# Patient Record
Sex: Female | Born: 1980 | Race: White | Hispanic: No | Marital: Married | Smoking: Current every day smoker
Health system: Southern US, Community
[De-identification: ages and names within clinical notes are randomized; demographics above are authoritative.]

## PROBLEM LIST (undated history)

## (undated) DIAGNOSIS — F329 Major depressive disorder, single episode, unspecified: Secondary | ICD-10-CM

## (undated) DIAGNOSIS — F319 Bipolar disorder, unspecified: Secondary | ICD-10-CM

## (undated) DIAGNOSIS — F419 Anxiety disorder, unspecified: Secondary | ICD-10-CM

## (undated) DIAGNOSIS — F32A Depression, unspecified: Secondary | ICD-10-CM

## (undated) DIAGNOSIS — G43909 Migraine, unspecified, not intractable, without status migrainosus: Secondary | ICD-10-CM

## (undated) DIAGNOSIS — T7840XA Allergy, unspecified, initial encounter: Secondary | ICD-10-CM

## (undated) DIAGNOSIS — D649 Anemia, unspecified: Secondary | ICD-10-CM

## (undated) HISTORY — PX: TOTAL ABDOMINAL HYSTERECTOMY W/ BILATERAL SALPINGOOPHORECTOMY: SHX83

## (undated) HISTORY — PX: ABDOMINAL HYSTERECTOMY: SHX81

## (undated) HISTORY — DX: Anxiety disorder, unspecified: F41.9

## (undated) HISTORY — DX: Allergy, unspecified, initial encounter: T78.40XA

## (undated) HISTORY — DX: Depression, unspecified: F32.A

## (undated) HISTORY — DX: Major depressive disorder, single episode, unspecified: F32.9

## (undated) HISTORY — DX: Bipolar disorder, unspecified: F31.9

## (undated) HISTORY — DX: Anemia, unspecified: D64.9

---

## 2017-09-13 ENCOUNTER — Encounter: Payer: Self-pay | Admitting: Emergency Medicine

## 2017-09-13 ENCOUNTER — Emergency Department: Payer: Self-pay

## 2017-09-13 ENCOUNTER — Other Ambulatory Visit: Payer: Self-pay

## 2017-09-13 ENCOUNTER — Emergency Department
Admission: EM | Admit: 2017-09-13 | Discharge: 2017-09-13 | Disposition: A | Discharge status: Home / Self Care | Payer: Self-pay | Attending: Emergency Medicine | Admitting: Emergency Medicine

## 2017-09-13 DIAGNOSIS — W010XXA Fall on same level from slipping, tripping and stumbling without subsequent striking against object, initial encounter: Secondary | ICD-10-CM | POA: Insufficient documentation

## 2017-09-13 DIAGNOSIS — F17210 Nicotine dependence, cigarettes, uncomplicated: Secondary | ICD-10-CM | POA: Insufficient documentation

## 2017-09-13 DIAGNOSIS — Y929 Unspecified place or not applicable: Secondary | ICD-10-CM | POA: Insufficient documentation

## 2017-09-13 DIAGNOSIS — S93492A Sprain of other ligament of left ankle, initial encounter: Secondary | ICD-10-CM | POA: Insufficient documentation

## 2017-09-13 DIAGNOSIS — Y9301 Activity, walking, marching and hiking: Secondary | ICD-10-CM | POA: Insufficient documentation

## 2017-09-13 DIAGNOSIS — Y999 Unspecified external cause status: Secondary | ICD-10-CM | POA: Insufficient documentation

## 2017-09-13 HISTORY — DX: Migraine, unspecified, not intractable, without status migrainosus: G43.909

## 2017-09-13 NOTE — ED Notes (Signed)
Pt reports she turned and twisted l ankle about 3 hours ago  - fell   Pt reports is able to ambulate but has pain

## 2017-09-13 NOTE — ED Provider Notes (Signed)
[facility] Emergency Department Provider Note ____________________________________________  Time seen: Approximately 11:27 PM  I have reviewed the triage vital signs and the nursing notes.   HISTORY  Chief Complaint Ankle Pain    HPI Ann Wilkerson is a 37 y.o. female who presents to the emergency department for evaluation and treatment of who presents to the emergency department for treatment and evaluation of left ankle pain.  Approximately 3 hours ago, she was backing up and did not realize that there was a brick divider behind her which caused her to fall.  She twisted her ankle and has had pain with attempted ambulation since.  She has taken ibuprofen 800 mg with some relief.  She denies previous ankle fracture. Past Medical History:  Diagnosis Date  . Migraines     There are no active problems to display for this patient.   History reviewed. No pertinent surgical history.  Prior to Admission medications   Not on File    Allergies Codeine  No family history on file.  Social History Social History   Tobacco Use  . Smoking status: Current Every Day Smoker    Packs/day: 0.50    Types: Cigarettes  . Smokeless tobacco: Never Used  Substance Use Topics  . Alcohol use: Not Currently  . Drug use: Not Currently    Review of Systems Constitutional: Negative for fever. Cardiovascular: Negative for chest pain. Respiratory: Negative for shortness of breath. Musculoskeletal: Positive for left ankle pain. Skin: Negative for open wound or lesion. Neurological: Negative for decrease in sensation  ____________________________________________   PHYSICAL EXAM:  VITAL SIGNS: ED Triage Vitals [09/13/17 2031]  Enc Vitals Group     BP 101/61     Pulse Rate 73     Resp 16     Temp 97.9 F (36.6 C)     Temp Source Oral     SpO2 98 %     Weight 97 lb (44 kg)     Height 5\' 4"  (1.626 m)     Head Circumference      Peak Flow      Pain Score 9      Pain Loc      Pain Edu?      Excl. in GC?     Constitutional: Alert and oriented. Well appearing and in no acute distress. Eyes: Conjunctivae are clear without discharge or drainage Head: Atraumatic Neck: Supple.  No midline tenderness. Respiratory: No cough. Respirations are even and unlabored. Musculoskeletal: ATFL pattern tenderness and swelling of the left ankle.  [place] ankle rules are negative.  No focal tenderness over the proximal fibula. Neurologic: Motor and sensory function is intact, specifically of the left lower extremity. Skin: No open wounds or lesions noted on exposed skin surfaces. Psychiatric: Affect and behavior are appropriate.  ____________________________________________   LABS (all labs ordered are listed, but only abnormal results are displayed)  Labs Reviewed - No data to display ____________________________________________  RADIOLOGY  Image of the left ankle is negative for acute bony abnormality per radiology. ____________________________________________   PROCEDURES  Procedures  ____________________________________________   INITIAL IMPRESSION / ASSESSMENT AND PLAN / ED COURSE  Ann Wilkerson is a 37 y.o. who presents to the emergency department for treatment and evaluation of left ankle pain.  She will be treated with an ankle stirrup splint and advised to follow-up with podiatry for symptoms that are not improving over the week.  Patient states that she will take her ibuprofen and declines any additional  medications to help with pain.  Medications - No data to display  Pertinent labs & imaging results that were available during my care of the patient were reviewed by me and considered in my medical decision making (see chart for details).  _________________________________________   FINAL CLINICAL IMPRESSION(S) / ED DIAGNOSES  Final diagnoses:  Sprain of anterior talofibular ligament of left ankle, initial encounter    ED  Discharge Orders    None       If controlled substance prescribed during this visit, 12 month history viewed on the [facility] prior to issuing an initial prescription for Schedule II or III opiod.    Chinita Pesterriplett, Cyra Spader B, FNP 09/13/17 2330    Myrna BlazerSchaevitz, David Matthew, MD 09/14/17 [phone]

## 2017-09-13 NOTE — ED Triage Notes (Signed)
Pt comes into the ED via POV c/o left ankle pain after tripping over a brick wall.  Patient has no deformity to the ankle and is ambulatory to triage.  Patient in NAD at this time.

## 2018-02-24 ENCOUNTER — Emergency Department
Admission: EM | Admit: 2018-02-24 | Discharge: 2018-02-24 | Disposition: A | Discharge status: Home / Self Care | Payer: Self-pay | Attending: Emergency Medicine | Admitting: Emergency Medicine

## 2018-02-24 ENCOUNTER — Encounter: Payer: Self-pay | Admitting: Emergency Medicine

## 2018-02-24 ENCOUNTER — Other Ambulatory Visit: Payer: Self-pay

## 2018-02-24 DIAGNOSIS — R110 Nausea: Secondary | ICD-10-CM | POA: Insufficient documentation

## 2018-02-24 DIAGNOSIS — J0100 Acute maxillary sinusitis, unspecified: Secondary | ICD-10-CM | POA: Insufficient documentation

## 2018-02-24 DIAGNOSIS — F17210 Nicotine dependence, cigarettes, uncomplicated: Secondary | ICD-10-CM | POA: Insufficient documentation

## 2018-02-24 LAB — URINALYSIS, COMPLETE (UACMP) WITH MICROSCOPIC
Bacteria, UA: NONE SEEN
Bilirubin Urine: NEGATIVE
GLUCOSE, UA: NEGATIVE mg/dL
Ketones, ur: NEGATIVE mg/dL
LEUKOCYTES UA: NEGATIVE
NITRITE: NEGATIVE
PH: 6 (ref 5.0–8.0)
Protein, ur: NEGATIVE mg/dL
Specific Gravity, Urine: 1.009 (ref 1.005–1.030)

## 2018-02-24 LAB — INFLUENZA PANEL BY PCR (TYPE A & B)
Influenza A By PCR: NEGATIVE
Influenza B By PCR: NEGATIVE

## 2018-02-24 MED ORDER — FEXOFENADINE-PSEUDOEPHED ER 60-120 MG PO TB12: 1.0000 | ORAL_TABLET | Freq: Two times a day (BID) | ORAL | 0 refills | Status: DC

## 2018-02-24 MED ORDER — AMOXICILLIN 500 MG PO CAPS: 500.0000 mg | ORAL_CAPSULE | Freq: Three times a day (TID) | ORAL | 0 refills | Status: DC

## 2018-02-24 MED ORDER — PROMETHAZINE HCL 25 MG PO TABS: 25.0000 mg | ORAL_TABLET | Freq: Four times a day (QID) | ORAL | 0 refills | Status: DC | PRN

## 2018-02-24 NOTE — ED Provider Notes (Signed)
[facility] Emergency Department Provider Note   ____________________________________________   First MD Initiated Contact with Patient 02/24/18 [phone]     (approximate)  I have reviewed the triage vital signs and the nursing notes.   HISTORY  Chief Complaint Fever and Nasal Congestion    HPI Ann Wilkerson is a 38 y.o. female patient complain of fever, sinus congestion, and nausea only after eating.  Patient states can tolerate fluids but decreased appetite with solid foods.  Patient denies diarrhea.  Patient is not taken flu shot for this season.  Patient denies pain.  No palliative measures for complaint.  Patient was concerned for sinus infection.  Past Medical History:  Diagnosis Date  . Migraines     There are no active problems to display for this patient.   History reviewed. No pertinent surgical history.  Prior to Admission medications   Medication Sig Start Date End Date Taking? Authorizing Provider  amoxicillin (AMOXIL) 500 MG capsule Take 1 capsule (500 mg total) by mouth 3 (three) times daily. 02/24/18   Joni Reining, PA-C  fexofenadine-pseudoephedrine (ALLEGRA-D) 60-120 MG 12 hr tablet Take 1 tablet by mouth 2 (two) times daily. 02/24/18   Joni Reining, PA-C  promethazine (PHENERGAN) 25 MG tablet Take 1 tablet (25 mg total) by mouth every 6 (six) hours as needed for nausea or vomiting. 02/24/18   Joni Reining, PA-C    Allergies Codeine  No family history on file.  Social History Social History   Tobacco Use  . Smoking status: Current Every Day Smoker    Packs/day: 0.50    Types: Cigarettes  . Smokeless tobacco: Never Used  Substance Use Topics  . Alcohol use: Not Currently  . Drug use: Not Currently    Review of Systems Constitutional: No fever/chills Eyes: No visual changes. ENT: Nasal congestion. Cardiovascular: Denies chest pain. Respiratory: Denies shortness of breath. Gastrointestinal: No abdominal pain.   Nausea and vomiting after eating.  No diarrhea.  No constipation. Genitourinary: Negative for dysuria. Musculoskeletal: Negative for back pain. Skin: Negative for rash. Neurological: Negative for headaches, focal weakness or numbness. Allergic/Immunilogical: Codeine. ____________________________________________   PHYSICAL EXAM:  VITAL SIGNS: ED Triage Vitals  Enc Vitals Group     BP 02/24/18 0902 107/70     Pulse Rate 02/24/18 0902 73     Resp 02/24/18 0902 16     Temp 02/24/18 0902 98 F (36.7 C)     Temp Source 02/24/18 0902 Oral     SpO2 02/24/18 0902 99 %     Weight 02/24/18 0858 98 lb (44.5 kg)     Height 02/24/18 0858 5\' 3"  (1.6 m)     Head Circumference --      Peak Flow --      Pain Score 02/24/18 0858 0     Pain Loc --      Pain Edu? --      Excl. in GC? --     Constitutional: Alert and oriented. Well appearing and in no acute distress. Nose: Bilateral maxillary guarding with edematous nasal turbinates. Mouth/Throat: Mucous membranes are moist.  Oropharynx non-erythematous.  Postnasal drainage. Neck: No stridor.  Hematological/Lymphatic/Immunilogical: No cervical lymphadenopathy. Cardiovascular: Normal rate, regular rhythm. Grossly normal heart sounds.  Good peripheral circulation. Respiratory: Normal respiratory effort.  No retractions. Lungs CTAB. Gastrointestinal: Soft and nontender. No distention. No abdominal bruits. No CVA tenderness. Musculoskeletal: No lower extremity tenderness nor edema.  No joint effusions. Neurologic:  Normal speech and language.  No gross focal neurologic deficits are appreciated. No gait instability. Skin:  Skin is warm, dry and intact. No rash noted. Psychiatric: Mood and affect are normal. Speech and behavior are normal.  ____________________________________________   LABS (all labs ordered are listed, but only abnormal results are displayed)  Labs Reviewed  URINALYSIS, COMPLETE (UACMP) WITH MICROSCOPIC - Abnormal; Notable for  the following components:      Result Value   Color, Urine YELLOW (*)    APPearance CLEAR (*)    Hgb urine dipstick MODERATE (*)    All other components within normal limits  INFLUENZA PANEL BY PCR (TYPE A & B)   ____________________________________________  EKG   ____________________________________________  RADIOLOGY  ED MD interpretation:    Official radiology report(s): No results found.  ____________________________________________   PROCEDURES  Procedure(s) performed: None  Procedures  Critical Care performed: No  ____________________________________________   INITIAL IMPRESSION / ASSESSMENT AND PLAN / ED COURSE  As part of my medical decision making, I reviewed the following data within the electronic MEDICAL RECORD NUMBER    Patient presents with nasal congestion intermittent fever.  Patient also complained of nausea.  Discussed negative flu and urinalysis results with patient.  Patient physical exam consistent with sinusitis.  Patient given discharge care instruction advised take medication as directed.  [facility].      ____________________________________________   FINAL CLINICAL IMPRESSION(S) / ED DIAGNOSES  Final diagnoses:  Subacute maxillary sinusitis     ED Discharge Orders         Ordered    amoxicillin (AMOXIL) 500 MG capsule  3 times daily     02/24/18 1048    fexofenadine-pseudoephedrine (ALLEGRA-D) 60-120 MG 12 hr tablet  2 times daily     02/24/18 1048    promethazine (PHENERGAN) 25 MG tablet  Every 6 hours PRN     02/24/18 1048           Note:  This document was prepared using Dragon voice recognition software and may include unintentional dictation errors.    Joni Reining, PA-C 02/24/18 1051    Minna Antis, MD 02/24/18 1435

## 2018-02-24 NOTE — ED Notes (Signed)
Says sick for 3 days with fever, nausea and pain in face/frontal area with sneezing and a lot of drainage.  Says she has had vomiting after she eats, but is able to keep fluids down.

## 2018-02-24 NOTE — ED Triage Notes (Signed)
Pt states concern for sinus infection, c/o congestion and fever off and on since Sunday. NAD.

## 2018-04-27 ENCOUNTER — Ambulatory Visit: Payer: PRIVATE HEALTH INSURANCE | Admitting: Family Medicine

## 2018-04-28 ENCOUNTER — Encounter: Payer: Self-pay | Admitting: Nurse Practitioner

## 2018-04-28 ENCOUNTER — Other Ambulatory Visit: Payer: Self-pay

## 2018-04-28 ENCOUNTER — Ambulatory Visit (INDEPENDENT_AMBULATORY_CARE_PROVIDER_SITE_OTHER): Payer: PRIVATE HEALTH INSURANCE | Admitting: Nurse Practitioner

## 2018-04-28 VITALS — BP 109/70 | HR 86 | Temp 98.5°F | Ht 61.5 in | Wt 102.0 lb

## 2018-04-28 DIAGNOSIS — F319 Bipolar disorder, unspecified: Secondary | ICD-10-CM

## 2018-04-28 DIAGNOSIS — J301 Allergic rhinitis due to pollen: Secondary | ICD-10-CM | POA: Diagnosis not present

## 2018-04-28 DIAGNOSIS — F17210 Nicotine dependence, cigarettes, uncomplicated: Secondary | ICD-10-CM

## 2018-04-28 DIAGNOSIS — J309 Allergic rhinitis, unspecified: Secondary | ICD-10-CM | POA: Insufficient documentation

## 2018-04-28 MED ORDER — BUSPIRONE HCL 5 MG PO TABS: 5.0000 mg | ORAL_TABLET | Freq: Two times a day (BID) | ORAL | 3 refills | Status: DC

## 2018-04-28 MED ORDER — HYDROXYZINE HCL 10 MG PO TABS: 10.0000 mg | ORAL_TABLET | Freq: Three times a day (TID) | ORAL | 0 refills | Status: DC | PRN

## 2018-04-28 MED ORDER — FLUTICASONE PROPIONATE 50 MCG/ACT NA SUSP: 2.0000 | Freq: Every day | NASAL | 6 refills | Status: DC

## 2018-04-28 NOTE — Assessment & Plan Note (Signed)
Will trial Claritin or Zyrtec, stop Allegra D.  Script for Flonase sent.  Adjust plan as needed.

## 2018-04-28 NOTE — Patient Instructions (Signed)
Buspirone tablets What is this medicine? BUSPIRONE (byoo SPYE rone) is used to treat anxiety disorders. This medicine may be used for other purposes; ask your health care provider or pharmacist if you have questions. COMMON BRAND NAME(S): BuSpar What should I tell my health care provider before I take this medicine? They need to know if you have any of these conditions: -kidney or liver disease -an unusual or allergic reaction to buspirone, other medicines, foods, dyes, or preservatives -pregnant or trying to get pregnant -breast-feeding How should I use this medicine? Take this medicine by mouth with a glass of water. Follow the directions on the prescription label. You may take this medicine with or without food. To ensure that this medicine always works the same way for you, you should take it either always with or always without food. Take your doses at regular intervals. Do not take your medicine more often than directed. Do not stop taking except on the advice of your doctor or health care professional. Talk to your pediatrician regarding the use of this medicine in children. Special care may be needed. Overdosage: If you think you have taken too much of this medicine contact a poison control center or emergency room at once. NOTE: This medicine is only for you. Do not share this medicine with others. What if I miss a dose? If you miss a dose, take it as soon as you can. If it is almost time for your next dose, take only that dose. Do not take double or extra doses. What may interact with this medicine? Do not take this medicine with any of the following medications: -linezolid -MAOIs like Carbex, Eldepryl, Marplan, Nardil, and Parnate -methylene blue -procarbazine This medicine may also interact with the following medications: -diazepam -digoxin -diltiazem -erythromycin -grapefruit juice -haloperidol -medicines for mental depression or mood problems -medicines for seizures like  carbamazepine, phenobarbital and phenytoin -nefazodone -other medications for anxiety -rifampin -ritonavir -some antifungal medicines like itraconazole, ketoconazole, and voriconazole -verapamil -warfarin This list may not describe all possible interactions. Give your health care provider a list of all the medicines, herbs, non-prescription drugs, or dietary supplements you use. Also tell them if you smoke, drink alcohol, or use illegal drugs. Some items may interact with your medicine. What should I watch for while using this medicine? Visit your doctor or health care professional for regular checks on your progress. It may take 1 to 2 weeks before your anxiety gets better. You may get drowsy or dizzy. Do not drive, use machinery, or do anything that needs mental alertness until you know how this drug affects you. Do not stand or sit up quickly, especially if you are an older patient. This reduces the risk of dizzy or fainting spells. Alcohol can make you more drowsy and dizzy. Avoid alcoholic drinks. What side effects may I notice from receiving this medicine? Side effects that you should report to your doctor or health care professional as soon as possible: -blurred vision or other vision changes -chest pain -confusion -difficulty breathing -feelings of hostility or anger -muscle aches and pains -numbness or tingling in hands or feet -ringing in the ears -skin rash and itching -vomiting -weakness Side effects that usually do not require medical attention (report to your doctor or health care professional if they continue or are bothersome): -disturbed dreams, nightmares -headache -nausea -restlessness or nervousness -sore throat and nasal congestion -stomach upset This list may not describe all possible side effects. Call your doctor for medical advice about side   effects. You may report side effects to FDA at 1-800-FDA-1088. Where should I keep my medicine? Keep out of the reach  of children. Store at room temperature below 30 degrees C (86 degrees F). Protect from light. Keep container tightly closed. Throw away any unused medicine after the expiration date. NOTE: This sheet is a summary. It may not cover all possible information. If you have questions about this medicine, talk to your doctor, pharmacist, or health care provider.  2019 [employer]vier/Gold Standard (2009-09-07 18:06:11)  

## 2018-04-28 NOTE — Progress Notes (Signed)
New Patient Office Visit  Subjective:  Patient ID: Ann Wilkerson, female    DOB: Sep 14, 1980  Age: 38 y.o. MRN: [id number]  CC:  Chief Complaint  Patient presents with  . Establish Care  . Cough    since last Saturday  . Nausea    food poisoning    HPI Ann Wilkerson presents for new patient visit to establish care.  Introduced to Publishing rights manager role and practice setting.  All questions answered.  Has not been seen by primary care provider in over a year, [place] [facility].    BIPOLAR DISORDER: Diagnosed in 2007.  Previously was on Seroquel XR and Klonopin.  Took Seroquel at night.  She has not been on medication since 2006 to 2007.  She reports the doctor she previously saw for psychiatry is no longer in practice and unsure she can obtain records from him.   She reports she has been on Wellbutrin, Celexa, Zoloft, and Paxil none of which helped with her mood.  Discussed at length that benzo medications are last line.  Will await past records.  Discussed trial of Buspar and Hydroxyzine, as she reports her mood is more anxiety driven and she has a lot of anxiety with work.  Denies manic episodes or depressive mood.  Denies SI/HI.  She reports no alcohol or drug use, but has f/h of alcohol abuse.  GAD 7 : Generalized Anxiety Score 04/28/2018  Nervous, Anxious, on Edge 1  Control/stop worrying 1  Worry too much - different things 1  Trouble relaxing 2  Restless 2  Easily annoyed or irritable 2  Afraid - awful might happen 2  Total GAD 7 Score 11   Depression screen PHQ 2/9 04/28/2018  Decreased Interest 1  Down, Depressed, Hopeless 0  PHQ - 2 Score 1  Altered sleeping 1  Tired, decreased energy 3  Change in appetite 3  Feeling bad or failure about yourself  0  Trouble concentrating 0  Moving slowly or fidgety/restless 0  Suicidal thoughts 0  PHQ-9 Score 8  Difficult doing work/chores Not difficult at all     UPPER RESPIRATORY TRACT INFECTION & NICOTINE DEPENDENCE  Reports nonproductive cough since Saturday, with allergy symptoms.  States she had food poisoning over weekend which has now improved.  She does endorse smoking for past 10 years, has tried Wellbutrin in past to quit.  Does not want to use Chantix.  Discussed use of OTC gum or lozenges, which she wishes to try.  Reports ongoing allergy issues for years and Allegra D not working. Worst symptom: cough and itchy eyes Fever: no Cough: yes Shortness of breath: no Wheezing: no Chest pain: no Chest tightness: no Chest congestion: no Nasal congestion: no Runny nose: yes Post nasal drip: yes Sneezing: yes Sore throat: yes Swollen glands: nos Sinus pressure: yes Headache: yes Face pain: no Toothache: no Ear pain: none Ear pressure: none Eyes red/itching:yes Eye drainage/crusting: no  Vomiting: no Rash: no Fatigue: no Sick contacts: no Strep contacts: no  Context: fluctuating Recurrent sinusitis: no Relief with OTC cold/cough medications: no  Treatments attempted: Allegra D   Past Medical History:  Diagnosis Date  . Allergy   . Anemia    when pregnant  . Anxiety   . Bipolar 1 disorder (HCC)   . Depression   . Migraines     Past Surgical History:  Procedure Laterality Date  . ABDOMINAL HYSTERECTOMY      Family History  Problem Relation Age of Onset  .  Pancreatic disease Mother   . Pancreatitis Mother   . Alcohol abuse Mother   . Hypertension Father   . Diabetes Father   . Migraines Father   . Cataracts Father   . Alcohol abuse Brother   . Diabetes Paternal Grandmother   . Hypertension Paternal Grandmother   . Dementia Paternal Grandmother     Social History   Socioeconomic History  . Marital status: Married    Spouse name: Not on file  . Number of children: Not on file  . Years of education: Not on file  . Highest education level: Not on file  Occupational History  . Not on file  Social Needs  . Financial resource strain: Not hard at all  . Food  insecurity:    Worry: Never true    Inability: Never true  . Transportation needs:    Medical: No    Non-medical: No  Tobacco Use  . Smoking status: Current Every Day Smoker    Packs/day: 0.25    Types: Cigarettes  . Smokeless tobacco: Never Used  Substance and Sexual Activity  . Alcohol use: Not Currently  . Drug use: Not Currently  . Sexual activity: Yes  Lifestyle  . Physical activity:    Days per week: 5 days    Minutes per session: 20 min  . Stress: Not at all  Relationships  . Social connections:    Talks on phone: Twice a week    Gets together: Twice a week    Attends religious service: More than 4 times per year    Active member of club or organization: No    Attends meetings of clubs or organizations: Never    Relationship status: Married  . Intimate partner violence:    Fear of current or ex partner: No    Emotionally abused: No    Physically abused: No    Forced sexual activity: No  Other Topics Concern  . Not on file  Social History Narrative  . Not on file    ROS Review of Systems  Constitutional: Negative for activity change, appetite change, fatigue and fever.  HENT: Positive for congestion, postnasal drip, rhinorrhea, sinus pressure, sneezing and sore throat. Negative for ear discharge, ear pain, facial swelling, sinus pain and voice change.   Eyes: Positive for itching. Negative for pain and visual disturbance.  Respiratory: Positive for cough. Negative for chest tightness, shortness of breath and wheezing.   Cardiovascular: Negative for chest pain, palpitations and leg swelling.  Gastrointestinal: Negative for abdominal distention, abdominal pain, constipation, diarrhea, nausea and vomiting.  Endocrine: Negative.   Musculoskeletal: Negative for myalgias.  Neurological: Negative for dizziness, numbness and headaches.  Psychiatric/Behavioral: The patient is nervous/anxious.     Objective:   Today's Vitals: BP 109/70   Pulse 86   Temp 98.5 F  (36.9 C) (Oral)   Ht 5' 1.5" (1.562 m)   Wt 102 lb (46.3 kg)   SpO2 98%   BMI 18.96 kg/m   Physical Exam Vitals signs and nursing note reviewed.  Constitutional:      General: She is awake.     Appearance: She is well-developed.  HENT:     Head: Normocephalic.     Right Ear: Hearing, ear canal and external ear normal. No drainage. A middle ear effusion is present.     Left Ear: Hearing, ear canal and external ear normal. No drainage. A middle ear effusion is present.     Nose: Rhinorrhea present. No mucosal edema.  Rhinorrhea is clear.     Right Sinus: No maxillary sinus tenderness or frontal sinus tenderness.     Left Sinus: No maxillary sinus tenderness or frontal sinus tenderness.     Mouth/Throat:     Mouth: Mucous membranes are moist.     Pharynx: Posterior oropharyngeal erythema (mild with cobblestone appearance) present. No pharyngeal swelling or oropharyngeal exudate.     Tonsils: Swelling: 0 on the right. 0 on the left.  Eyes:     General:        Right eye: No discharge.        Left eye: No discharge.     Conjunctiva/sclera: Conjunctivae normal.     Pupils: Pupils are equal, round, and reactive to light.  Neck:     Musculoskeletal: Normal range of motion and neck supple.     Thyroid: No thyromegaly.     Vascular: No carotid bruit or JVD.  Cardiovascular:     Rate and Rhythm: Normal rate and regular rhythm.     Heart sounds: Normal heart sounds.  Pulmonary:     Effort: Pulmonary effort is normal.     Breath sounds: Normal breath sounds.     Comments: Clear throughout without adventitious sounds. Abdominal:     General: Bowel sounds are normal.     Palpations: Abdomen is soft.  Lymphadenopathy:     Cervical: No cervical adenopathy.  Skin:    General: Skin is warm and dry.  Neurological:     Mental Status: She is alert and oriented to person, place, and time.  Psychiatric:        Attention and Perception: Attention normal.        Mood and Affect: Mood normal.         Speech: Speech normal.        Behavior: Behavior normal. Behavior is cooperative.        Thought Content: Thought content normal.        Judgment: Judgment normal.     Comments: Normal speech and behavior with no anxiety noted.     Assessment & Plan:   Problem List Items Addressed This Visit      Respiratory   Allergic rhinitis    Will trial Claritin or Zyrtec, stop Allegra D.  Script for Flonase sent.  Adjust plan as needed.        Other   Nicotine dependence, cigarettes, uncomplicated    I have recommended complete cessation of tobacco use. I have discussed various options available for assistance with tobacco cessation including over the counter methods (Nicotine gum, patch and lozenges). We also discussed prescription options (Chantix, Nicotine Inhaler / Nasal Spray). The patient is not interested in pursuing any prescription tobacco cessation options at this time.      Bipolar I disorder (HCC) - Primary    Chronic, ongoing with anxiety element.  Has been followed by psych in past and seen therapy.  Attempt to obtain these records.  Reports has "tried everything", but Seroquel and Klonopin worked best.  At this time, initial visit, will await further records for review.  Trial Buspar twice a day scheduled and and Hydroxyzine as needed.  Return in 4 weeks for physical and follow-up.         Outpatient Encounter Medications as of 04/28/2018  Medication Sig  . fexofenadine-pseudoephedrine (ALLEGRA-D) 60-120 MG 12 hr tablet Take 1 tablet by mouth 2 (two) times daily.  . Multiple Vitamin (MULTIVITAMINS PO) Take by mouth daily.  . busPIRone (BUSPAR)  5 MG tablet Take 1 tablet (5 mg total) by mouth 2 (two) times daily.  . fluticasone (FLONASE) 50 MCG/ACT nasal spray Place 2 sprays into both nostrils daily.  . hydrOXYzine (ATARAX/VISTARIL) 10 MG tablet Take 1 tablet (10 mg total) by mouth 3 (three) times daily as needed.  . [DISCONTINUED] amoxicillin (AMOXIL) 500 MG capsule Take  1 capsule (500 mg total) by mouth 3 (three) times daily.  . [DISCONTINUED] promethazine (PHENERGAN) 25 MG tablet Take 1 tablet (25 mg total) by mouth every 6 (six) hours as needed for nausea or vomiting.   No facility-administered encounter medications on file as of 04/28/2018.     Follow-up: Return in about 4 weeks (around 05/26/2018) for Annual physical (no pap, had total hysto).   Marjie Skiff, NP

## 2018-04-28 NOTE — Assessment & Plan Note (Signed)
I have recommended complete cessation of tobacco use. I have discussed various options available for assistance with tobacco cessation including over the counter methods (Nicotine gum, patch and lozenges). We also discussed prescription options (Chantix, Nicotine Inhaler / Nasal Spray). The patient is not interested in pursuing any prescription tobacco cessation options at this time.  

## 2018-04-28 NOTE — Assessment & Plan Note (Signed)
Chronic, ongoing with anxiety element.  Has been followed by psych in past and seen therapy.  Attempt to obtain these records.  Reports has "tried everything", but Seroquel and Klonopin worked best.  At this time, initial visit, will await further records for review.  Trial Buspar twice a day scheduled and and Hydroxyzine as needed.  Return in 4 weeks for physical and follow-up.

## 2018-05-26 ENCOUNTER — Encounter: Payer: PRIVATE HEALTH INSURANCE | Admitting: Nurse Practitioner

## 2018-05-26 ENCOUNTER — Other Ambulatory Visit: Payer: Self-pay

## 2018-06-01 ENCOUNTER — Ambulatory Visit (INDEPENDENT_AMBULATORY_CARE_PROVIDER_SITE_OTHER): Payer: PRIVATE HEALTH INSURANCE | Admitting: Family Medicine

## 2018-06-01 ENCOUNTER — Other Ambulatory Visit: Payer: Self-pay

## 2018-06-01 ENCOUNTER — Encounter: Payer: Self-pay | Admitting: Family Medicine

## 2018-06-01 VITALS — Temp 98.9°F | Ht 63.0 in

## 2018-06-01 DIAGNOSIS — F319 Bipolar disorder, unspecified: Secondary | ICD-10-CM

## 2018-06-01 DIAGNOSIS — J301 Allergic rhinitis due to pollen: Secondary | ICD-10-CM | POA: Diagnosis not present

## 2018-06-01 MED ORDER — HYDROXYZINE HCL 10 MG PO TABS: 5.0000 mg | ORAL_TABLET | Freq: Three times a day (TID) | ORAL | 3 refills | Status: DC | PRN

## 2018-06-01 MED ORDER — QUETIAPINE FUMARATE ER 50 MG PO TB24: 50.0000 mg | ORAL_TABLET | Freq: Every day | ORAL | 3 refills | Status: DC

## 2018-06-01 NOTE — Assessment & Plan Note (Signed)
Still feeling very anxious. Will restart seroquel xr 50mg  (lowest dose) and recheck 1 month. Continue buspar and hydroxyzine- advised cutting hydroxyzine in 1/2 to avoid sedation. Continue to monitor. Call with any concerns.

## 2018-06-01 NOTE — Progress Notes (Signed)
Temp 98.9 F (37.2 C) (Oral)   Ht 5\' 3"  (1.6 m)   BMI 18.07 kg/m    Subjective:    Patient ID: Ann Wilkerson, female    DOB: 12/11/1980, 38 y.o.   MRN: [id number]  HPI: Ann Wilkerson is a 38 y.o. female  Chief Complaint  Patient presents with  . Allergies    flonase f/u  . Manic Behavior    hydroxyxine refill   BIPOLAR DISORDER- newly established with PCP last visit. Awaiting records regarding her previous bipolar treatment. Had been on seroquel and klonopin. Now on Buspar and Hydroxyzine. She notes that she is doing OK overall. She notes that the buspar is not doing anything. She notes that the hydroxyzine seems to help, but it makes her very tired to the point where she was concerned about falling asleep while driving. Was on seroquel xr in the past and that seemed like it helped Mood status: stable Satisfied with current treatment?: no Symptom severity: moderate  Duration of current treatment : 1 month Side effects: no Medication compliance: good compliance Previous psychiatric medications: 'Lots' seroquel and klonopin worked Depressed mood: yes Anxious mood: yes Anhedonia: no Significant weight loss or gain: no Insomnia: no  Fatigue: yes Feelings of worthlessness or guilt: no Impaired concentration/indecisiveness: no Suicidal ideations: no Hopelessness: no Crying spells: no Depression screen [facility] 2/9 06/01/2018 04/28/2018  Decreased Interest 2 1  Down, Depressed, Hopeless 0 0  PHQ - 2 Score 2 1  Altered sleeping 3 1  Tired, decreased energy 3 3  Change in appetite 3 3  Feeling bad or failure about yourself  0 0  Trouble concentrating 0 0  Moving slowly or fidgety/restless 0 0  Suicidal thoughts 0 0  PHQ-9 Score 11 8  Difficult doing work/chores - Not difficult at all   GAD 7 : Generalized Anxiety Score 06/01/2018 04/28/2018  Nervous, Anxious, on Edge 0 1  Control/stop worrying 0 1  Worry too much - different things 1 1  Trouble relaxing 3 2  Restless 3 2   Easily annoyed or irritable 1 2  Afraid - awful might happen 0 2  Total GAD 7 Score 8 11    Allergies have been doing well with the flonase. Would like to continue it. No other concerns.  Relevant past medical, surgical, family and social history reviewed and updated as indicated. Interim medical history since our last visit reviewed. Allergies and medications reviewed and updated.  Review of Systems  Constitutional: Negative.   Respiratory: Negative.   Cardiovascular: Negative.   Musculoskeletal: Negative.   Skin: Negative.   Neurological: Negative.   Psychiatric/Behavioral: Positive for dysphoric mood. Negative for agitation, behavioral problems, confusion, decreased concentration, hallucinations, self-injury, sleep disturbance and suicidal ideas. The patient is nervous/anxious. The patient is not hyperactive.     Per HPI unless specifically indicated above     Objective:    Temp 98.9 F (37.2 C) (Oral)   Ht 5\' 3"  (1.6 m)   BMI 18.07 kg/m   Wt Readings from Last 3 Encounters:  04/28/18 102 lb (46.3 kg)  02/24/18 98 lb (44.5 kg)  09/13/17 97 lb (44 kg)    Physical Exam Vitals signs and nursing note reviewed.  Constitutional:      General: She is not in acute distress.    Appearance: Normal appearance. She is not ill-appearing, toxic-appearing or diaphoretic.  HENT:     Head: Normocephalic and atraumatic.     Right Ear: External ear normal.  Left Ear: External ear normal.     Nose: Nose normal.     Mouth/Throat:     Mouth: Mucous membranes are moist.     Pharynx: Oropharynx is clear.  Eyes:     General: No scleral icterus.       Right eye: No discharge.        Left eye: No discharge.     Conjunctiva/sclera: Conjunctivae normal.     Pupils: Pupils are equal, round, and reactive to light.  Neck:     Musculoskeletal: Normal range of motion.  Pulmonary:     Effort: Pulmonary effort is normal. No respiratory distress.     Comments: Speaking in full sentences  Musculoskeletal: Normal range of motion.  Skin:    Coloration: Skin is not jaundiced or pale.     Findings: No bruising, erythema, lesion or rash.  Neurological:     Mental Status: She is alert and oriented to person, place, and time. Mental status is at baseline.  Psychiatric:        Mood and Affect: Mood normal.        Behavior: Behavior normal.        Thought Content: Thought content normal.        Judgment: Judgment normal.     Results for orders placed or performed during the hospital encounter of 02/24/18  Influenza panel by PCR (type A & B)  Result Value Ref Range   Influenza A By PCR NEGATIVE NEGATIVE   Influenza B By PCR NEGATIVE NEGATIVE  Urinalysis, Complete w Microscopic  Result Value Ref Range   Color, Urine YELLOW (A) YELLOW   APPearance CLEAR (A) CLEAR   Specific Gravity, Urine 1.009 1.005 - 1.030   pH 6.0 5.0 - 8.0   Glucose, UA NEGATIVE NEGATIVE mg/dL   Hgb urine dipstick MODERATE (A) NEGATIVE   Bilirubin Urine NEGATIVE NEGATIVE   Ketones, ur NEGATIVE NEGATIVE mg/dL   Protein, ur NEGATIVE NEGATIVE mg/dL   Nitrite NEGATIVE NEGATIVE   Leukocytes, UA NEGATIVE NEGATIVE   RBC / HPF 11-20 0 - 5 RBC/hpf   WBC, UA 0-5 0 - 5 WBC/hpf   Bacteria, UA NONE SEEN NONE SEEN   Squamous Epithelial / LPF 0-5 0 - 5   Mucus PRESENT       Assessment & Plan:   Problem List Items Addressed This Visit      Respiratory   Allergic rhinitis    Doing well on her flonase. Will continue. Refills available at her pharmacy. Encouraged patient to go get them. Call with any concerns.         Other   Bipolar I disorder (HCC) - Primary    Still feeling very anxious. Will restart seroquel xr  (lowest dose) and recheck 1 month. Continue buspar and hydroxyzine- advised cutting hydroxyzine in 1/2 to avoid sedation. Continue to monitor. Call with any concerns.           Follow up plan: Return in about 4 weeks (around 06/29/2018) for follow up bipolar with Jolene.   . This  visit was completed via FaceTime due to the restrictions of the COVID-19 pandemic. All issues as above were discussed and addressed. Physical exam was done as above through visual confirmation on FaceTime. If it was felt that the patient should be evaluated in the office, they were directed there. The patient verbally consented to this visit. . Location of the patient: home . Location of the provider: home . Those involved with this call:  .  Provider: Olevia PerchesMegan Parco, DO . CMA: Elton SinAnita Quito, CMA . Front Desk/Registration: Adela Portshristan Williamson  . Time spent on call: 15 minutes with patient face to face via video conference. More than 50% of this time was spent in counseling and coordination of care. 23 minutes total spent in review of patient's record and preparation of their chart.

## 2018-06-01 NOTE — Assessment & Plan Note (Addendum)
Doing well on her flonase. Will continue. Refills available at her pharmacy. Encouraged patient to go get them. Call with any concerns.

## 2018-06-03 ENCOUNTER — Ambulatory Visit: Payer: Self-pay

## 2018-06-03 NOTE — Telephone Encounter (Signed)
On Monday, pt accidentally bit through her tongue. Pt stated that she was holding her dog and the dog moved his head and hit pt's lower jaw. Pt stated her tongue bled for 3 hours. Pt stated now there is white tissue on top and on the right side of her tongue and there is a know where her tooth cut through.  Pt stated there is mild swelling. No trouble breathing and pt was talking without slurring words. Reassured pt that the white tissue is healing tissue. Pt was asking if he could use hydrogen peroxide to help. Care advice given and pt verbalized understanding. Pt's e-mail verified and advised pt visit will be virtual.       Reason for Disposition . [1] Looks infected (increasing pain, redness or swelling after 48 hrs) AND [2] no fever  Answer Assessment - Initial Assessment Questions 1. ONSET: "When did the swelling start?" (e.g., minutes, hours, days)     monday 2. LOCATION: "What part of the tongue is swollen?"    Right side 3. SEVERITY: "How swollen is it?"    Mild 4. CAUSE: "What do you think is causing the tongue swelling?" (e.g., hx of angioedema, allergies)     Tooth bite 5. RECURRENT SYMPTOM: "Have you had tongue swelling before?" If so, ask: "When was the last time?" "What happened that time?"    no 6. OTHER SYMPTOMS: "Do you have any other symptoms?" (e.g., difficulty breathing, facial swelling)     no 7. PREGNANCY: "Is there any chance you are pregnant?" "When was your last menstrual period?"     n/a  Answer Assessment - Initial Assessment Questions 1. MECHANISM: "How did the injury happen?"      Pt's dog hit pt's bottom jaw and one of her upper teeth bit through her tongue. 2. ONSET: "When did the injury happen?" (Minutes or hours ago)      06/01/18 3. LOCATION: "What part of the mouth is injured?"      right front tongue 4. APPEARANCE of INJURY: "What does the mouth look like?"      White tissue at the side of the bite and to the top of the tongue -- pt stated she  can feel a knot where her tongue went through 5. BLEEDING: "Is the mouth still bleeding?" If so, ask: "Is it difficult to stop?"      no 6. SIZE: For cuts, bruises, or swelling, ask: "How large is it?" (e.g., inches or centimeters; entire lip)      Left side of tongue 7. PAIN: "Is it painful?" If so, ask: "How bad is the pain?"   (e.g., Scale 1-10; or mild, moderate, severe)     mild 8. TETANUS: For any breaks in the skin, ask: "When was the last tetanus booster?"     n/a 9. OTHER SYMPTOMS: "Are you having any trouble breathing?"     no 10. PREGNANCY: "Is there any chance you are pregnant?" "When was your last menstrual period?"       no  Protocols used: MOUTH INJURY-A-AH, TONGUE SWELLING-A-AH

## 2018-06-04 NOTE — Telephone Encounter (Signed)
LVM for patient to call to schedule virtual appointment

## 2018-06-04 NOTE — Telephone Encounter (Signed)
Yes, tomorrow afternoon

## 2018-06-04 NOTE — Telephone Encounter (Signed)
We can schedule her for visit tomorrow afternoon.  Thanks.

## 2018-06-05 ENCOUNTER — Ambulatory Visit (INDEPENDENT_AMBULATORY_CARE_PROVIDER_SITE_OTHER): Payer: PRIVATE HEALTH INSURANCE | Admitting: Nurse Practitioner

## 2018-06-05 ENCOUNTER — Encounter: Payer: Self-pay | Admitting: Nurse Practitioner

## 2018-06-05 ENCOUNTER — Other Ambulatory Visit: Payer: Self-pay

## 2018-06-05 VITALS — Temp 98.9°F | Ht 63.0 in | Wt 110.0 lb

## 2018-06-05 DIAGNOSIS — S01512A Laceration without foreign body of oral cavity, initial encounter: Secondary | ICD-10-CM | POA: Diagnosis not present

## 2018-06-05 NOTE — Assessment & Plan Note (Signed)
Acute with no drainage.  Recommend salt water mouth rinses 4-6 times a day and after every meal.  May take Tylenol or Ibuprofen for discomfort.  Apply ice to area a few times a day for 10 minutes to help with discomfort.  Soft food diet at home until no further discomfort to area.   Avoid drinking or eating anything hot until discomfort improved.  Monitor for s/s of infection and report to provider immediately if present.

## 2018-06-05 NOTE — Patient Instructions (Signed)
Tongue Laceration A tongue laceration is a cut on the tongue. Most cuts on the tongue heal without any problems. Minor cuts usually do not need stitches (sutures). A more serious cut may need stitches if:  It goes all the way through the tongue.  It is on the side of the tongue. Very bad cuts may also be treated with medicine to prevent infection (antibiotic medicine). Follow these instructions at home: Medicines   Take over-the-counter and prescription medicines only as told by your doctor.  If you were prescribed an antibiotic medicine, take it as told by your doctor. Do not stop taking it even if you start to feel better.  If you were told to use a germ-killing (antiseptic) mouth rinse, use it exactly as told by your doctor. Wound care  If your cut was closed with stitches, do not pull on them. Leave the stitches in place for as long as your doctor tells you to. Follow instructions from your doctor about: ? How to care for your stitches. ? When and how your stitches will need to be taken out.  If told, put ice on your cut. ? Cover an ice cube with a thin cloth. ? Hold the covered ice cube on the cut for 1-3 minutes, if you can. Do this 4 times a day for 1-2 days. Oral hygiene  After one day, use salt-water or other mouth rinses 4-6 times a day or as told by your doctor. To make a salt-water mixture, completely dissolve -1 tsp (2.5-5 mL) of salt in 1 cup (237 mL) of warm water.  If you did not injure your teeth, gently brush and floss them as usual. ? Do not brush or floss loose or broken teeth. ? Do not brush or floss teeth that have been put back in the right place by your doctor. Eating and drinking   Follow instructions from your doctor about what you cannot eat or drink. Do not eat hard or chewy foods until your cut has healed. Your doctor may suggest a liquid or soft diet.  Rinse your mouth with water after each time that you eat.  Do not eat hot food or have any hot  drinks while your mouth is numb. General instructions  Do not use any products that contain nicotine or tobacco, such as cigarettes, e-cigarettes, and chewing tobacco. These can delay healing. If you need help quitting, ask your doctor.  Keep all follow-up visits as told by your doctor. This is important. Contact a doctor if:  You have a fever.  You have pus coming from your cut.  A cut that was closed breaks open. Get help right away if you have:  Bleeding that does not stop when you put pressure on it.  Trouble breathing.  Swelling that is getting worse.  More pain in your cut or in other parts of your mouth, neck, or face. Summary  A tongue laceration is a cut on the tongue.  Minor cuts usually do not need stitches. A more serious cut may need stitches.  Follow instructions from your doctor about what you cannot eat or drink.  Rinse your mouth with water after each time that you eat. This information is not intended to replace advice given to you by your health care provider. Make sure you discuss any questions you have with your health care provider. Document Released: 04/22/2011 Document Revised: 07/10/2017 Document Reviewed: 07/10/2017 Elsevier Interactive Patient Education  2019 [employer].

## 2018-06-05 NOTE — Progress Notes (Signed)
Temp 98.9 F (37.2 C) (Oral)   Ht 5\' 3"  (1.6 m)   Wt 110 lb (49.9 kg)   BMI 19.49 kg/m    Subjective:    Patient ID: Ann Wilkerson, female    DOB: 08/15/1980, 38 y.o.   MRN: [id number]  HPI: Ann Wilkerson is a 38 y.o. female  Chief Complaint  Patient presents with  . Tongue Injury    Patient states she bit her tongue Monday. Tongue bleed for appx 2 hours. Still has "knot" on tongue.     . This visit was completed via WebEx due to the restrictions of the COVID-19 pandemic. All issues as above were discussed and addressed. Physical exam was done as above through visual confirmation on WebEx. If it was felt that the patient should be evaluated in the office, they were directed there. The patient verbally consented to this visit. . Location of the patient: home . Location of the provider: home . Those involved with this call:  . Provider: Aura Dials, DNP . CMA: Myrtha Mantis, CMA . Front Desk/Registration: Adela Ports  . Time spent on call: 15 minutes with patient face to face via video conference. More than 50% of this time was spent in counseling and coordination of care. 5 minutes total spent in review of patient's record and preparation of their chart.   TRAUMA TO TONGUE: Her dog head butted her Monday causing her to bite her tongue. She ended up biting her tongue on the right side and states she now has a swollen area there now that is painful.  States swelling has improved since initial bite.  She reports she cleansed area with some hydrogen peroxide.  Has not done any other treatments.  Denies any drainage to area, fever, or difficulty swallowing.  Reports that it bled for a couple hours after bite, but then improved.  Has not taken anything for pain at home.  States pain is intermittent, throbbing, 5/10 at worst.    Relevant past medical, surgical, family and social history reviewed and updated as indicated. Interim medical history since our last visit reviewed.  Allergies and medications reviewed and updated.  Review of Systems  Constitutional: Negative for activity change, appetite change, diaphoresis, fatigue and fever.  HENT: Negative for trouble swallowing.   Respiratory: Negative for cough, chest tightness and shortness of breath.   Cardiovascular: Negative for chest pain, palpitations and leg swelling.  Gastrointestinal: Negative for abdominal distention, abdominal pain, constipation, diarrhea, nausea and vomiting.  Neurological: Negative for dizziness, syncope, weakness, light-headedness, numbness and headaches.  Psychiatric/Behavioral: Negative.     Per HPI unless specifically indicated above     Objective:    Temp 98.9 F (37.2 C) (Oral)   Ht 5\' 3"  (1.6 m)   Wt 110 lb (49.9 kg)   BMI 19.49 kg/m   Wt Readings from Last 3 Encounters:  06/05/18 110 lb (49.9 kg)  04/28/18 102 lb (46.3 kg)  02/24/18 98 lb (44.5 kg)    Physical Exam Vitals signs and nursing note reviewed.  Constitutional:      General: She is awake. She is not in acute distress.    Appearance: She is well-developed.  HENT:     Head: Normocephalic.     Right Ear: Hearing normal.     Left Ear: Hearing normal.     Nose: Nose normal.     Mouth/Throat:     Mouth: Mucous membranes are moist. Injury present.   Eyes:     General: Lids  are normal.        Right eye: No discharge.        Left eye: No discharge.     Conjunctiva/sclera: Conjunctivae normal.  Neck:     Musculoskeletal: Normal range of motion.     Thyroid: No thyromegaly.     Vascular: No JVD.  Cardiovascular:     Comments: Unable to auscultate due to virtual visit only Pulmonary:     Effort: Pulmonary effort is normal. No accessory muscle usage or respiratory distress.     Comments: Unable to auscultate due to virtual visit only Neurological:     Mental Status: She is alert and oriented to person, place, and time.  Psychiatric:        Attention and Perception: Attention normal.        Mood  and Affect: Mood normal.        Behavior: Behavior normal. Behavior is cooperative.        Thought Content: Thought content normal.        Judgment: Judgment normal.     Results for orders placed or performed during the hospital encounter of 02/24/18  Influenza panel by PCR (type A & B)  Result Value Ref Range   Influenza A By PCR NEGATIVE NEGATIVE   Influenza B By PCR NEGATIVE NEGATIVE  Urinalysis, Complete w Microscopic  Result Value Ref Range   Color, Urine YELLOW (A) YELLOW   APPearance CLEAR (A) CLEAR   Specific Gravity, Urine 1.009 1.005 - 1.030   pH 6.0 5.0 - 8.0   Glucose, UA NEGATIVE NEGATIVE mg/dL   Hgb urine dipstick MODERATE (A) NEGATIVE   Bilirubin Urine NEGATIVE NEGATIVE   Ketones, ur NEGATIVE NEGATIVE mg/dL   Protein, ur NEGATIVE NEGATIVE mg/dL   Nitrite NEGATIVE NEGATIVE   Leukocytes, UA NEGATIVE NEGATIVE   RBC / HPF 11-20 0 - 5 RBC/hpf   WBC, UA 0-5 0 - 5 WBC/hpf   Bacteria, UA NONE SEEN NONE SEEN   Squamous Epithelial / LPF 0-5 0 - 5   Mucus PRESENT       Assessment & Plan:   Problem List Items Addressed This Visit      Digestive   Tongue laceration - Primary    Acute with no drainage.  Recommend salt water mouth rinses 4-6 times a day and after every meal.  May take Tylenol or Ibuprofen for discomfort.  Apply ice to area a few times a day for 10 minutes to help with discomfort.  Soft food diet at home until no further discomfort to area.   Avoid drinking or eating anything hot until discomfort improved.  Monitor for s/s of infection and report to provider immediately if present.          I discussed the assessment and treatment plan with the patient. The patient was provided an opportunity to ask questions and all were answered. The patient agreed with the plan and demonstrated an understanding of the instructions.   The patient was advised to call back or seek an in-person evaluation if the symptoms worsen or if the condition fails to improve as  anticipated.   I provided 15 minutes of time during this encounter.   Follow up plan: Return if symptoms worsen or fail to improve.

## 2018-07-01 ENCOUNTER — Telehealth: Payer: Self-pay | Admitting: Nurse Practitioner

## 2018-07-01 NOTE — Telephone Encounter (Signed)
LVM for patient to call back to schedule follow up appt with [facility]

## 2018-07-07 ENCOUNTER — Encounter: Payer: Self-pay | Admitting: Nurse Practitioner

## 2018-07-07 ENCOUNTER — Other Ambulatory Visit: Payer: Self-pay

## 2018-07-07 ENCOUNTER — Ambulatory Visit: Payer: PRIVATE HEALTH INSURANCE | Admitting: Nurse Practitioner

## 2018-07-07 ENCOUNTER — Telehealth (INDEPENDENT_AMBULATORY_CARE_PROVIDER_SITE_OTHER): Payer: PRIVATE HEALTH INSURANCE | Admitting: Nurse Practitioner

## 2018-07-07 DIAGNOSIS — F319 Bipolar disorder, unspecified: Secondary | ICD-10-CM | POA: Diagnosis not present

## 2018-07-07 MED ORDER — QUETIAPINE FUMARATE 50 MG PO TABS
50.0000 mg | ORAL_TABLET | Freq: Every day | ORAL | 3 refills | Status: DC
Start: 2018-07-07 — End: 2020-02-21

## 2018-07-07 NOTE — Addendum Note (Signed)
Addended by: Aura Dials T on: 07/07/2018 03:23 PM   Modules accepted: Level of Service

## 2018-07-07 NOTE — Progress Notes (Signed)
There were no vitals taken for this visit.   Subjective:    Patient ID: Ann Wilkerson, female    DOB: 1980/09/28, 38 y.o.   MRN: [id number]  HPI: Ann Wilkerson is a 38 y.o. female  Chief Complaint  Patient presents with  . Follow-up  . Manic Behavior    Seroquel causing fatigue.     . This visit was completed via telephone due to the restrictions of the COVID-19 pandemic. All issues as above were discussed and addressed but no physical exam was performed. If it was felt that the patient should be evaluated in the office, they were directed there. The patient verbally consented to this visit. Patient was unable to complete an audio/visual visit due to Lack of equipment. Due to the catastrophic nature of the COVID-19 pandemic, this visit was done through audio contact only. . Location of the patient: home . Location of the provider: home . Those involved with this call:  . Provider: Aura Dials, DNP . CMA: Wilhemena Durie, CMA . Front Desk/Registration: Harriet Pho  . Time spent on call: 15 minutes on the phone discussing health concerns. 10 minutes total spent in review of patient's record and preparation of their chart. I verified patient identity using two factors (patient name and date of birth). Patient consents verbally to being seen via telemedicine visit today.   BIPOLAR DISORDER: On visit 06/01/2018 patient was started on Seroquel XR 50 MG, which worked for patient in past per her report.  Buspar and Hydroxyzine were continued.  Previously reported she had been on Klonopin in past as well and that psychiatrist she previously saw is no longer in practice, had advised at visit 04/28/2018 to attempt to obtain these past records.  States she takes Seroquel 20 minutes before bedtime and it is making her drowsy during daytime next day.  Overall, she reports Seroquel has helped mood, but is making her "too tired during the day".  Discussed switching to IR instead of XR, which she  agrees with.  Currently is minimally taking Hydroxyzine and Buspar, every other day if needed for anxiety but not on regular basis.  Does endorse improvement in anxiety, but continues to have periods due to current Covid pandemic.  Reports in past with Bipolar Disorder she fluctuated from depressed to having highs of spending money "I should not have".  Denies any history of drug/alcohol use or gambling, Mood status: stable Satisfied with current treatment?: reports fatigue with XR Seroquel Symptom severity: mild  Duration of current treatment : chronic Side effects: fatigue during daytime Medication compliance: good compliance Psychotherapy/counseling: none Previous psychiatric medications: has tried multiple medications in past Depressed mood: occasional, but improved with Seroquel Anxious mood: occasional, but improved Anhedonia: no Significant weight loss or gain: no Insomnia: none at this time with Seroquel Fatigue: yes Feelings of worthlessness or guilt: no Impaired concentration/indecisiveness: no Suicidal ideations: no Hopelessness: no Crying spells: no Depression screen [facility] 2/9 06/01/2018 04/28/2018  Decreased Interest 2 1  Down, Depressed, Hopeless 0 0  PHQ - 2 Score 2 1  Altered sleeping 3 1  Tired, decreased energy 3 3  Change in appetite 3 3  Feeling bad or failure about yourself  0 0  Trouble concentrating 0 0  Moving slowly or fidgety/restless 0 0  Suicidal thoughts 0 0  PHQ-9 Score 11 8  Difficult doing work/chores - Not difficult at all    Relevant past medical, surgical, family and social history reviewed and updated as indicated. Interim medical  history since our last visit reviewed. Allergies and medications reviewed and updated.  Review of Systems  Constitutional: Positive for fatigue (during daytime with XR Seroquel). Negative for activity change, appetite change, diaphoresis and fever.  Respiratory: Negative for cough, chest tightness and shortness of  breath.   Cardiovascular: Negative for chest pain, palpitations and leg swelling.  Gastrointestinal: Negative for abdominal distention, abdominal pain, constipation, diarrhea, nausea and vomiting.  Neurological: Negative for dizziness, syncope, weakness, light-headedness, numbness and headaches.  Psychiatric/Behavioral: Negative for decreased concentration, self-injury, sleep disturbance and suicidal ideas. The patient is not hyperactive.     Per HPI unless specifically indicated above     Objective:    There were no vitals taken for this visit.  Wt Readings from Last 3 Encounters:  06/05/18 110 lb (49.9 kg)  04/28/18 102 lb (46.3 kg)  02/24/18 98 lb (44.5 kg)    Physical Exam   No physical exam due to telephone visit only, patient virtual access not working.  Results for orders placed or performed during the hospital encounter of 02/24/18  Influenza panel by PCR (type A & B)  Result Value Ref Range   Influenza A By PCR NEGATIVE NEGATIVE   Influenza B By PCR NEGATIVE NEGATIVE  Urinalysis, Complete w Microscopic  Result Value Ref Range   Color, Urine YELLOW (A) YELLOW   APPearance CLEAR (A) CLEAR   Specific Gravity, Urine 1.009 1.005 - 1.030   pH 6.0 5.0 - 8.0   Glucose, UA NEGATIVE NEGATIVE mg/dL   Hgb urine dipstick MODERATE (A) NEGATIVE   Bilirubin Urine NEGATIVE NEGATIVE   Ketones, ur NEGATIVE NEGATIVE mg/dL   Protein, ur NEGATIVE NEGATIVE mg/dL   Nitrite NEGATIVE NEGATIVE   Leukocytes, UA NEGATIVE NEGATIVE   RBC / HPF 11-20 0 - 5 RBC/hpf   WBC, UA 0-5 0 - 5 WBC/hpf   Bacteria, UA NONE SEEN NONE SEEN   Squamous Epithelial / LPF 0-5 0 - 5   Mucus PRESENT       Assessment & Plan:   Problem List Items Addressed This Visit      Other   Bipolar I disorder (HCC)    Chronic, ongoing.  Reports improvement in mood with Seroquel XR, but fatigue during daytime hours.  Will switch to regular Seroquel, maintain current 50 MG dose.  Continue Buspar and Hydroxyzine as needed,  plan to discontinue one of these at next visit if does well on regular Seroquel.  Adjust Seroquel dose as needed based on mood.  Continue to monitor.  Return in 4 weeks for physical and mood follow-up.         I discussed the assessment and treatment plan with the patient. The patient was provided an opportunity to ask questions and all were answered. The patient agreed with the plan and demonstrated an understanding of the instructions.   The patient was advised to call back or seek an in-person evaluation if the symptoms worsen or if the condition fails to improve as anticipated.   I provided 15 minutes of time during this encounter.  Follow up plan: Return in about 4 weeks (around 08/04/2018) for Annual physical and mood follow-up.

## 2018-07-07 NOTE — Patient Instructions (Signed)
Bipolar 1 Disorder °Bipolar 1 disorder is a mental health disorder in which a person has episodes of emotional highs (mania), and may also have episodes of emotional lows (depression) in addition to highs. Bipolar 1 disorder is different from other bipolar disorders because it involves extreme manic episodes. These episodes last at least one week or involve symptoms that are so severe that hospitalization is needed to keep the person safe. °What increases the risk? °The cause of this condition is not known. However, certain factors make you more likely to have bipolar disorder, such as: °· Having a family member with the disorder. °· An imbalance of certain chemicals in the brain (neurotransmitters). °· Stress, such as illness, financial problems, or a death. °· Certain conditions that affect the brain or spinal cord (neurologic conditions). °· Brain injury (trauma). °· Having another mental health disorder, such as: °? Obsessive compulsive disorder. °? Schizophrenia. °What are the signs or symptoms? °Symptoms of mania include: °· Very high self-esteem or self-confidence. °· Decreased need for sleep. °· Unusual talkativeness or feeling a need to keep talking. Speech may be very fast. It may seem like you cannot stop talking. °· Racing thoughts or constant talking, with quick shifts between topics that may or may not be related (flight of ideas). °· Decreased ability to focus or concentrate. °· Increased purposeful activity, such as work, studies, or social activity. °· Increased nonproductive activity. This could be pacing, squirming and fidgeting, or finger and toe tapping. °· Impulsive behavior and poor judgment. This may result in high-risk activities, such as having unprotected sex or spending a lot of money. °Symptoms of depression include: °· Feeling sad, hopeless, or helpless. °· Frequent or uncontrollable crying. °· Lack of feeling or caring about anything. °· Sleeping too much. °· Moving more slowly than  usual. °· Not being able to enjoy things you used to enjoy. °· Wanting to be alone all the time. °· Feeling guilty or worthless. °· Lack of energy or motivation. °· Trouble concentrating or remembering. °· Trouble making decisions. °· Increased appetite. °· Thoughts of death, or the desire to harm yourself. °Sometimes, you may have a mixed mood. This means having symptoms of depression and mania. Stress can make symptoms worse. °How is this diagnosed? °To diagnose bipolar disorder, your health care provider may ask about your: °· Emotional episodes. °· Medical history. °· Alcohol and drug use. This includes prescription medicines. Certain medical conditions and substances can cause symptoms that seem like bipolar disorder (secondary bipolar disorder). °How is this treated? °Bipolar disorder is a long-term (chronic) illness. It is best controlled with ongoing (continuous) treatment rather than treatment only when symptoms occur. Treatment may include: °· Medicine. Medicine can be prescribed by a provider who specializes in treating mental disorders (psychiatrist). °? Medicines called mood stabilizers are usually prescribed. °? If symptoms occur even while taking a mood stabilizer, other medicines may be added. °· Psychotherapy. Some forms of talk therapy, such as cognitive-behavioral therapy (CBT), can provide support, education, and guidance. °· Coping methods, such as journaling or relaxation exercises. These may include: °? Yoga. °? Meditation. °? Deep breathing. °· Lifestyle changes, such as: °? Limiting alcohol and drug use. °? Exercising regularly. °? Getting plenty of sleep. °? Making healthy eating choices. ° °A combination of medicine, talk therapy, and coping methods is best. A procedure in which electricity is applied to the brain through the scalp (electroconvulsive therapy) may be used in cases of severe mania when medicine and psychotherapy work too   slowly or do not work. °Follow these instructions at  home: °Activity ° °· Return to your normal activities as told by your health care provider. °· Find activities that you enjoy, and make time to do them. °· Exercise regularly as told by your health care provider. °Lifestyle °· Limit alcohol intake to no more than 1 drink a day for nonpregnant women and 2 drinks a day for men. One drink equals 12 oz of beer, 5 oz of wine, or 1½ oz of hard liquor. °· Follow a set schedule for eating and sleeping. °· Eat a balanced diet that includes fresh fruits and vegetables, whole grains, low-fat dairy, and lean meat. °· Get 7-8 hours of sleep each night. °General instructions °· Take over-the-counter and prescription medicines only as told by your health care provider. °· Think about joining a support group. Your health care provider may be able to recommend a support group. °· Talk with your family and loved ones about your treatment goals and how they can help. °· Keep all follow-up visits as told by your health care provider. This is important. °Where to find more information °For more information about bipolar disorder, visit the following websites: °· National Alliance on Mental Illness: www.nami.org °· [place] National Institute of Mental Health: www.nimh.nih.gov °Contact a health care provider if: °· Your symptoms get worse. °· You have side effects from your medicine, and they get worse. °· You have trouble sleeping. °· You have trouble doing daily activities. °· You feel unsafe in your surroundings. °· You are dealing with substance abuse. °Get help right away if: °· You have new symptoms. °· You have thoughts about harming yourself. °· You self-harm. °This information is not intended to replace advice given to you by your health care provider. Make sure you discuss any questions you have with your health care provider. °Document Released: 05/06/2000 Document Revised: 09/24/2015 Document Reviewed: 09/28/2015 °Elsevier Interactive Patient Education © 2019 [employer] ° °

## 2018-07-07 NOTE — Assessment & Plan Note (Signed)
Chronic, ongoing.  Reports improvement in mood with Seroquel XR, but fatigue during daytime hours.  Will switch to regular Seroquel, maintain current 50 MG dose.  Continue Buspar and Hydroxyzine as needed, plan to discontinue one of these at next visit if does well on regular Seroquel.  Adjust Seroquel dose as needed based on mood.  Continue to monitor.  Return in 4 weeks for physical and mood follow-up.

## 2018-08-10 ENCOUNTER — Encounter: Payer: Self-pay | Admitting: Family Medicine

## 2018-08-10 ENCOUNTER — Telehealth: Payer: Self-pay | Admitting: *Deleted

## 2018-08-10 ENCOUNTER — Other Ambulatory Visit: Payer: Self-pay

## 2018-08-10 ENCOUNTER — Ambulatory Visit (INDEPENDENT_AMBULATORY_CARE_PROVIDER_SITE_OTHER): Payer: PRIVATE HEALTH INSURANCE | Admitting: Family Medicine

## 2018-08-10 VITALS — Temp 99.0°F | Ht 64.0 in | Wt 107.0 lb

## 2018-08-10 DIAGNOSIS — R5383 Other fatigue: Secondary | ICD-10-CM

## 2018-08-10 DIAGNOSIS — R0981 Nasal congestion: Secondary | ICD-10-CM

## 2018-08-10 DIAGNOSIS — Z20822 Contact with and (suspected) exposure to covid-19: Secondary | ICD-10-CM

## 2018-08-10 DIAGNOSIS — J301 Allergic rhinitis due to pollen: Secondary | ICD-10-CM | POA: Diagnosis not present

## 2018-08-10 MED ORDER — PREDNISONE 10 MG PO TABS: ORAL_TABLET | ORAL | 0 refills | Status: DC

## 2018-08-10 MED ORDER — CETIRIZINE HCL 10 MG PO TABS: 10.0000 mg | ORAL_TABLET | Freq: Every day | ORAL | 5 refills | Status: DC

## 2018-08-10 MED ORDER — AMOXICILLIN-POT CLAVULANATE 875-125 MG PO TABS: 1.0000 | ORAL_TABLET | Freq: Two times a day (BID) | ORAL | 0 refills | Status: DC

## 2018-08-10 MED ORDER — FLUTICASONE PROPIONATE 50 MCG/ACT NA SUSP: 2.0000 | Freq: Every day | NASAL | 6 refills | Status: DC

## 2018-08-10 NOTE — Telephone Encounter (Signed)
-----   Message from Volney American, [place] sent at 08/10/2018  1:45 PM EDT ----- Regarding: COVID 19 testing Patient with 2 days of low grade fever, congestion, fatigue, headache Generic [employer]

## 2018-08-10 NOTE — Progress Notes (Signed)
Temp 99 F (37.2 C) (Oral)   Ht 5\' 4"  (1.626 m)   Wt 107 lb (48.5 kg)   BMI 18.37 kg/m    Subjective:    Patient ID: Ann Wilkerson, female    DOB: 03/04/1980, 38 y.o.   MRN: [id number]  HPI: Ann Wilkerson is a 38 y.o. female  Chief Complaint  Patient presents with  . Nasal Congestion    Ongoing 2 days. Temeperature began last night, but has not reached 100.  Marland Kitchen. Headache  . Sneezing  . Sinusitis    . This visit was completed via WebEx due to the restrictions of the COVID-19 pandemic. All issues as above were discussed and addressed. Physical exam was done as above through visual confirmation on WebEx. If it was felt that the patient should be evaluated in the office, they were directed there. The patient verbally consented to this visit. . Location of the patient: home . Location of the provider: home . Those involved with this call:  . Provider: Roosvelt Maserachel Carolynne Schuchard, PA-C . CMA: Myrtha MantisKeri Bullock, CMA . Front Desk/Registration: Harriet PhoJoliza Grandberry  . Time spent on call: 20 minutes with patient face to face via video conference. More than 50% of this time was spent in counseling and coordination of care. 5 minutes total spent in review of patient's record and preparation of their chart. I verified patient identity using two factors (patient name and date of birth). Patient consents verbally to being seen via telemedicine visit today.   Started 2 days ago with fatigue, congestion, sinus pressure, headache, low grade fevers in the 99 range. Taking fonase with moderate relief. Out of allegra so used some zyrtec which seemed to work well. Hx of allergic rhinitis and states she gets several sinus infections per year. Unsure if she's had COVID 19 positive exposures. Denies CP, SOB, N/V/D.   Relevant past medical, surgical, family and social history reviewed and updated as indicated. Interim medical history since our last visit reviewed. Allergies and medications reviewed and updated.  Review of Systems   Per HPI unless specifically indicated above     Objective:    Temp 99 F (37.2 C) (Oral)   Ht 5\' 4"  (1.626 m)   Wt 107 lb (48.5 kg)   BMI 18.37 kg/m   Wt Readings from Last 3 Encounters:  08/10/18 107 lb (48.5 kg)  06/05/18 110 lb (49.9 kg)  04/28/18 102 lb (46.3 kg)    Physical Exam Vitals signs and nursing note reviewed.  Constitutional:      General: She is not in acute distress.    Appearance: Normal appearance.  HENT:     Head: Atraumatic.     Right Ear: External ear normal.     Left Ear: External ear normal.     Nose: Nose normal. No congestion.     Mouth/Throat:     Mouth: Mucous membranes are moist.     Pharynx: Oropharynx is clear. Posterior oropharyngeal erythema present.  Eyes:     Extraocular Movements: Extraocular movements intact.     Conjunctiva/sclera: Conjunctivae normal.  Neck:     Musculoskeletal: Normal range of motion.  Cardiovascular:     Comments: Unable to assess via virtual visit Pulmonary:     Effort: Pulmonary effort is normal. No respiratory distress.  Musculoskeletal: Normal range of motion.  Skin:    General: Skin is dry.     Findings: No erythema.  Neurological:     Mental Status: She is alert and oriented to person,  place, and time.  Psychiatric:        Mood and Affect: Mood normal.        Thought Content: Thought content normal.        Judgment: Judgment normal.     Results for orders placed or performed during the hospital encounter of 02/24/18  Influenza panel by PCR (type A & B)  Result Value Ref Range   Influenza A By PCR NEGATIVE NEGATIVE   Influenza B By PCR NEGATIVE NEGATIVE  Urinalysis, Complete w Microscopic  Result Value Ref Range   Color, Urine YELLOW (A) YELLOW   APPearance CLEAR (A) CLEAR   Specific Gravity, Urine 1.009 1.005 - 1.030   pH 6.0 5.0 - 8.0   Glucose, UA NEGATIVE NEGATIVE mg/dL   Hgb urine dipstick MODERATE (A) NEGATIVE   Bilirubin Urine NEGATIVE NEGATIVE   Ketones, ur NEGATIVE NEGATIVE mg/dL    Protein, ur NEGATIVE NEGATIVE mg/dL   Nitrite NEGATIVE NEGATIVE   Leukocytes, UA NEGATIVE NEGATIVE   RBC / HPF 11-20 0 - 5 RBC/hpf   WBC, UA 0-5 0 - 5 WBC/hpf   Bacteria, UA NONE SEEN NONE SEEN   Squamous Epithelial / LPF 0-5 0 - 5   Mucus PRESENT       Assessment & Plan:   Problem List Items Addressed This Visit      Respiratory   Allergic rhinitis    Other Visit Diagnoses    Nasal congestion    -  Primary   Fatigue, unspecified type        Will refer for COVID 19 testing and quarantine until results are back/feeling much better. F/u in 1 week for recheck. Start prednisone in meantime as sxs are consistent with allergic rhinitis going into sinusitis. If not improving by end of week, can start augmentin rx. Consistent allergy regimen reviewed   Follow up plan: Return in about 1 week (around 08/17/2018) for Sick f/u.

## 2018-08-10 NOTE — Telephone Encounter (Signed)
Patient scheduled for covid testing on 6/30 @ 1:00 @ [facility]

## 2018-08-11 ENCOUNTER — Other Ambulatory Visit: Payer: Self-pay

## 2018-08-11 ENCOUNTER — Telehealth: Payer: Self-pay

## 2018-08-11 DIAGNOSIS — Z20822 Contact with and (suspected) exposure to covid-19: Secondary | ICD-10-CM

## 2018-08-11 NOTE — Telephone Encounter (Signed)
Pt called because she overslept and missed her appt. Pt rescheduled for tomorrow at [facility] at 10:00. Pt advised to wear mask and to stay in car to testing site. Address given pt verbalized understanding.

## 2018-08-12 ENCOUNTER — Other Ambulatory Visit: Payer: Self-pay

## 2018-08-12 DIAGNOSIS — Z20822 Contact with and (suspected) exposure to covid-19: Secondary | ICD-10-CM

## 2018-08-12 NOTE — Addendum Note (Signed)
Addended by: Denman George on: 08/12/2018 09:42 AM   Modules accepted: Orders

## 2018-08-19 ENCOUNTER — Encounter: Payer: Self-pay | Admitting: Nurse Practitioner

## 2018-08-19 ENCOUNTER — Ambulatory Visit (INDEPENDENT_AMBULATORY_CARE_PROVIDER_SITE_OTHER): Payer: PRIVATE HEALTH INSURANCE | Admitting: Nurse Practitioner

## 2018-08-19 ENCOUNTER — Other Ambulatory Visit: Payer: Self-pay

## 2018-08-19 DIAGNOSIS — Z20828 Contact with and (suspected) exposure to other viral communicable diseases: Secondary | ICD-10-CM | POA: Diagnosis not present

## 2018-08-19 DIAGNOSIS — Z20822 Contact with and (suspected) exposure to covid-19: Secondary | ICD-10-CM | POA: Insufficient documentation

## 2018-08-19 LAB — NOVEL CORONAVIRUS, NAA: SARS-CoV-2, NAA: DETECTED — AB

## 2018-08-19 NOTE — Patient Instructions (Signed)

## 2018-08-19 NOTE — Progress Notes (Signed)
There were no vitals taken for this visit.   Subjective:    Patient ID: Ann LeavensLinda Wilkerson, female    DOB: 09/21/1980, 38 y.o.   MRN: [id number]  HPI: Ann LeavensLinda Wilkerson is a 38 y.o. female  Chief Complaint  Patient presents with  . COVID     Test results not back. Symptoms same, other then fever has d/c   . Medication Management    Pt did not finish prednisode, did not feel like it helped.    COVID FOLLOW-UP: Seen on 08/10/18 for nasal congestion and fatigue.  Had testing done on 08/12/2018.  No results returned as of yet, order still showing as active.  Took Prednisone, but no improvement.  Has started her abx, started Augmentin.  Is not taking any OTC medications.  Taking Flonase, which is helping.  Taking Zyrtec, which is helping too. Has loss of taste and smell, this continues. Fever: no Cough: yes Shortness of breath: no Wheezing: no Chest pain: no Chest tightness: no Chest congestion: no Nasal congestion: yes Runny nose: no Post nasal drip: no Sneezing: no Sore throat: no Swollen glands: no Sinus pressure: no Headache: no Face pain: no Toothache: no Ear pain: none Ear pressure: none Eyes red/itching:no Eye drainage/crusting: no  Vomiting: no Rash: no Fatigue: yes Sick contacts: no Strep contacts: no  Context: stable Recurrent sinusitis: no Relief with OTC cold/cough medications: yes  Treatments attempted: cold/sinus, anti-histamine and antibiotics   Relevant past medical, surgical, family and social history reviewed and updated as indicated. Interim medical history since our last visit reviewed. Allergies and medications reviewed and updated.  Review of Systems  Constitutional: Positive for fatigue. Negative for activity change, appetite change, chills, diaphoresis and fever.  HENT: Positive for congestion. Negative for ear discharge, ear pain, facial swelling, postnasal drip, rhinorrhea, sinus pressure, sinus pain, sneezing, sore throat and voice change.   Eyes:  Negative for pain and visual disturbance.  Respiratory: Positive for cough. Negative for chest tightness, shortness of breath and wheezing.   Cardiovascular: Negative for chest pain, palpitations and leg swelling.  Gastrointestinal: Negative for abdominal distention, abdominal pain, constipation, diarrhea, nausea and vomiting.  Endocrine: Negative.   Musculoskeletal: Negative for myalgias.  Neurological: Negative for dizziness, numbness and headaches.  Psychiatric/Behavioral: Negative.     Per HPI unless specifically indicated above     Objective:    There were no vitals taken for this visit.  Wt Readings from Last 3 Encounters:  08/10/18 107 lb (48.5 kg)  06/05/18 110 lb (49.9 kg)  04/28/18 102 lb (46.3 kg)    Physical Exam Vitals signs and nursing note reviewed.  Constitutional:      General: She is awake. She is not in acute distress.    Appearance: She is well-developed. She is not ill-appearing.  HENT:     Head: Normocephalic.     Right Ear: Hearing normal. No drainage.     Left Ear: Hearing normal. No drainage.     Nose: No rhinorrhea.     Right Sinus: No maxillary sinus tenderness or frontal sinus tenderness.     Left Sinus: No maxillary sinus tenderness or frontal sinus tenderness.     Comments: Self palpated sinuses.    Mouth/Throat:     Pharynx: No pharyngeal swelling, oropharyngeal exudate or posterior oropharyngeal erythema.  Eyes:     General: Lids are normal.        Right eye: No discharge.        Left eye: No discharge.  Conjunctiva/sclera: Conjunctivae normal.  Neck:     Musculoskeletal: Normal range of motion.  Cardiovascular:     Comments: Unable to auscultate due to virtual exam only  Pulmonary:     Effort: Pulmonary effort is normal. No accessory muscle usage or respiratory distress.     Comments: Unable to auscultate due to virtual exam only.  No SOB with talking.  Intermittent nonproductive cough noted. Neurological:     Mental Status: She is  alert and oriented to person, place, and time.  Psychiatric:        Attention and Perception: Attention normal.        Mood and Affect: Mood normal.        Behavior: Behavior normal. Behavior is cooperative.        Thought Content: Thought content normal.        Judgment: Judgment normal.     Results for orders placed or performed during the hospital encounter of 02/24/18  Influenza panel by PCR (type A & B)  Result Value Ref Range   Influenza A By PCR NEGATIVE NEGATIVE   Influenza B By PCR NEGATIVE NEGATIVE  Urinalysis, Complete w Microscopic  Result Value Ref Range   Color, Urine YELLOW (A) YELLOW   APPearance CLEAR (A) CLEAR   Specific Gravity, Urine 1.009 1.005 - 1.030   pH 6.0 5.0 - 8.0   Glucose, UA NEGATIVE NEGATIVE mg/dL   Hgb urine dipstick MODERATE (A) NEGATIVE   Bilirubin Urine NEGATIVE NEGATIVE   Ketones, ur NEGATIVE NEGATIVE mg/dL   Protein, ur NEGATIVE NEGATIVE mg/dL   Nitrite NEGATIVE NEGATIVE   Leukocytes, UA NEGATIVE NEGATIVE   RBC / HPF 11-20 0 - 5 RBC/hpf   WBC, UA 0-5 0 - 5 WBC/hpf   Bacteria, UA NONE SEEN NONE SEEN   Squamous Epithelial / LPF 0-5 0 - 5   Mucus PRESENT       Assessment & Plan:   Problem List Items Addressed This Visit      Other   Exposure to Covid-19 Virus    Had testing 08/12/18, results not returned.  Have alerted [facility] to review.  Will notify patient once available.  Had recommended she continue to stay inside until symptoms improved and results returned.  Continue abx and OTC medications + increased fluid and rest.  Return for worsening or continued symptoms.          Follow up plan: Return if symptoms worsen or fail to improve.

## 2018-08-19 NOTE — Assessment & Plan Note (Signed)
Had testing 08/12/18, results not returned.  Have alerted [facility] to review.  Will notify patient once available.  Had recommended she continue to stay inside until symptoms improved and results returned.  Continue abx and OTC medications + increased fluid and rest.  Return for worsening or continued symptoms.

## 2018-08-19 NOTE — Progress Notes (Signed)
Spoke to patient via telephone and notified her of positive results.  Advised her to stay in for 14 days or until symptoms have improved.  She can continue OTC medications as needed for symptom management.  Also discussed with her if any worsening symptoms, such as increased SOB or CP, she is immediately to go to ER/wearing mask/and notify them she is positive for Covid + have face to face assessment.  She will need a 2 week follow-up scheduled virtually please, if you could do that for me.  Thanks.

## 2018-08-19 NOTE — Progress Notes (Signed)
Spoke to [facility] staff and Covid swab is pending, they do have swab.  They are unable to say when result will be available as have had massive influx of tests.  Alerted patient to this via telephone.  She stated appreciation for call.   Will update her once result available.

## 2018-08-28 ENCOUNTER — Encounter: Payer: PRIVATE HEALTH INSURANCE | Admitting: Nurse Practitioner

## 2019-02-03 ENCOUNTER — Telehealth: Payer: Self-pay | Admitting: Family Medicine

## 2019-02-03 NOTE — Telephone Encounter (Signed)
[place] for virtual visit  Copied from [place]. Topic: Appointment Scheduling - Scheduling Inquiry for Clinic >> Feb 03, 2019  9:12 AM Scherrie Gerlach wrote: Reason for CRM: pt thinks she has covid again or a sinus infection.  Henrine Screws is out and she is requesting a virtual visit tomorrow. Please advise if ok

## 2019-02-04 ENCOUNTER — Ambulatory Visit (INDEPENDENT_AMBULATORY_CARE_PROVIDER_SITE_OTHER): Payer: PRIVATE HEALTH INSURANCE | Admitting: Family Medicine

## 2019-02-04 ENCOUNTER — Other Ambulatory Visit: Payer: Self-pay

## 2019-02-04 ENCOUNTER — Encounter: Payer: Self-pay | Admitting: Family Medicine

## 2019-02-04 VITALS — Temp 99.1°F | Ht 64.0 in | Wt 107.0 lb

## 2019-02-04 DIAGNOSIS — R0981 Nasal congestion: Secondary | ICD-10-CM

## 2019-02-04 DIAGNOSIS — R519 Headache, unspecified: Secondary | ICD-10-CM

## 2019-02-04 NOTE — Progress Notes (Signed)
Temp 99.1 F (37.3 C) (Oral)   Ht 5\' 4"  (1.626 m)   Wt 107 lb (48.5 kg)   BMI 18.37 kg/m    Subjective:    Patient ID: Ann Wilkerson, female    DOB: [birth year], 38 y.o.   MRN: [id number]  HPI: Ann Wilkerson is a 38 y.o. female  Chief Complaint  Patient presents with  . Covid 19 expossure  . Cough  . Chills  . Fever  . Dizziness    . This visit was completed via WebEx due to the restrictions of the COVID-19 pandemic. All issues as above were discussed and addressed. Physical exam was done as above through visual confirmation on WebEx. If it was felt that the patient should be evaluated in the office, they were directed there. The patient verbally consented to this visit. . Location of the patient: home . Location of the provider: work . Those involved with this call:  . Provider: Merrie Roof, PA-C . CMA: Lesle Chris, [place] . Front Desk/Registration: Jill Side  . Time spent on call: 15 minutes with patient face to face via video conference. More than 50% of this time was spent in counseling and coordination of care. 5 minutes total spent in review of patient's record and preparation of their chart. I verified patient identity using two factors (patient name and date of birth). Patient consents verbally to being seen via telemedicine visit today.   Started about 4 days ago with headaches, fatigue, SOB, nasal congestion, sinus pain and pressure, chills, generalized body aches. Notes she stopped wearing her mask as often the past few weeks and came into contact recently with someone who was positive for COVID. Denies fever, N/V/D, CP, wheezing. Has not been trying anything other than some sudafed and her usual allergy regimen which doesn't seem to be helping.   Relevant past medical, surgical, family and social history reviewed and updated as indicated. Interim medical history since our last visit reviewed. Allergies and medications reviewed and updated.  Review of Systems  Per  HPI unless specifically indicated above     Objective:    Temp 99.1 F (37.3 C) (Oral)   Ht 5\' 4"  (1.626 m)   Wt 107 lb (48.5 kg)   BMI 18.37 kg/m   Wt Readings from Last 3 Encounters:  02/04/19 107 lb (48.5 kg)  08/10/18 107 lb (48.5 kg)  06/05/18 110 lb (49.9 kg)    Physical Exam Vitals and nursing note reviewed.  Constitutional:      General: She is not in acute distress.    Appearance: Normal appearance.  HENT:     Head: Atraumatic.     Right Ear: External ear normal.     Left Ear: External ear normal.     Nose: Congestion present.     Mouth/Throat:     Mouth: Mucous membranes are moist.     Pharynx: Oropharynx is clear. Posterior oropharyngeal erythema present.  Eyes:     Extraocular Movements: Extraocular movements intact.     Conjunctiva/sclera: Conjunctivae normal.  Cardiovascular:     Comments: Unable to assess via virtual visit Pulmonary:     Effort: Pulmonary effort is normal. No respiratory distress.  Musculoskeletal:        General: Normal range of motion.     Cervical back: Normal range of motion.  Skin:    General: Skin is dry.     Findings: No erythema.  Neurological:     Mental Status: She is alert and oriented  to person, place, and time.  Psychiatric:        Mood and Affect: Mood normal.        Thought Content: Thought content normal.        Judgment: Judgment normal.     Results for orders placed or performed in visit on 08/12/18  Novel Coronavirus, NAA ([facility])  Result Value Ref Range   SARS-CoV-2, NAA Detected (A) Not Detected      Assessment & Plan:   Problem List Items Addressed This Visit    None    Visit Diagnoses    Acute nonintractable headache, unspecified headache type    -  Primary   Relevant Orders   Novel Coronavirus, NAA ([facility])   Nasal congestion       Relevant Orders   Novel Coronavirus, NAA ([facility])      COVID testing ordered, patient counseled on isolation protocol, supportive care, and strict return  precautions for worsening sxs. Continue allergy regimen and OTC comfort care.   Follow up plan: Return if symptoms worsen or fail to improve.

## 2019-02-08 ENCOUNTER — Ambulatory Visit: Payer: PRIVATE HEALTH INSURANCE | Attending: Internal Medicine

## 2019-02-08 DIAGNOSIS — Z20822 Contact with and (suspected) exposure to covid-19: Secondary | ICD-10-CM

## 2019-02-10 LAB — NOVEL CORONAVIRUS, NAA: SARS-CoV-2, NAA: NOT DETECTED

## 2019-02-11 ENCOUNTER — Ambulatory Visit: Payer: PRIVATE HEALTH INSURANCE | Admitting: Nurse Practitioner

## 2019-04-22 ENCOUNTER — Telehealth (INDEPENDENT_AMBULATORY_CARE_PROVIDER_SITE_OTHER): Payer: PRIVATE HEALTH INSURANCE | Admitting: Nurse Practitioner

## 2019-04-22 ENCOUNTER — Encounter: Payer: Self-pay | Admitting: Nurse Practitioner

## 2019-04-22 DIAGNOSIS — K047 Periapical abscess without sinus: Secondary | ICD-10-CM | POA: Diagnosis not present

## 2019-04-22 MED ORDER — LIDOCAINE VISCOUS HCL 2 % MT SOLN: 15.0000 mL | OROMUCOSAL | 2 refills | Status: DC | PRN

## 2019-04-22 MED ORDER — AMOXICILLIN-POT CLAVULANATE 875-125 MG PO TABS: 1.0000 | ORAL_TABLET | Freq: Two times a day (BID) | ORAL | 0 refills | Status: AC

## 2019-04-22 NOTE — Progress Notes (Signed)
There were no vitals taken for this visit.   Subjective:    Patient ID: Ann Wilkerson, female    DOB: Mar 18, 1980, 39 y.o.   MRN: [id number]  HPI: Ann Wilkerson is a 39 y.o. female  Chief Complaint  Patient presents with  . Dental Pain    pt states she has an abcess in the right side of her jaw. States it came up Sunday.     . This visit was completed via MyChart due to the restrictions of the COVID-19 pandemic. All issues as above were discussed and addressed. Physical exam was done as above through visual confirmation on MyChart. If it was felt that the patient should be evaluated in the office, they were directed there. The patient verbally consented to this visit. . Location of the patient: home . Location of the provider: home . Those involved with this call:  . Provider: Marnee Guarneri, DNP . CMA: Yvonna Alanis, CMA . Front Desk/Registration: Don Perking  . Time spent on call: 15 minutes with patient face to face via video conference. More than 50% of this time was spent in counseling and coordination of care. 10 minutes total spent in review of patient's record and preparation of their chart.  . I verified patient identity using two factors (patient name and date of birth). Patient consents verbally to being seen via telemedicine visit today.    DENTAL PAIN Had some leftover abx and started taking it, but it has not been helping.  Noticed red and swollen spot on her gum lower right side.  There is a occasional dull and sharp pain.  Area is sensitive and warm feeling. Duration: initially noticed on Sunday and bigger on Monday Involved teeth: right and lower Dentist evaluation: no Mechanism of injury:  unknown Onset: sudden Severity: moderate Quality: dull and sharp Frequency: intermittent Radiation: none Aggravating factors: chewing Alleviating factors: NSAIDs Status: fluctuating Treatments attempted: NSAIDs Relief with NSAIDs?: moderate Fevers:  no Swelling: yes Redness: yes Paresthesias / decreased sensation: no Sinus pressure: no  Relevant past medical, surgical, family and social history reviewed and updated as indicated. Interim medical history since our last visit reviewed. Allergies and medications reviewed and updated.  Review of Systems  Constitutional: Negative for appetite change, fatigue and fever.  HENT: Positive for dental problem. Negative for mouth sores.   Respiratory: Negative for cough, chest tightness and shortness of breath.   Cardiovascular: Negative for chest pain, palpitations and leg swelling.  Gastrointestinal: Negative.   Neurological: Negative.   Psychiatric/Behavioral: Negative.     Per HPI unless specifically indicated above     Objective:    There were no vitals taken for this visit.  Wt Readings from Last 3 Encounters:  02/04/19 107 lb (48.5 kg)  08/10/18 107 lb (48.5 kg)  06/05/18 110 lb (49.9 kg)    Physical Exam Vitals and nursing note reviewed.  Constitutional:      General: She is awake. She is not in acute distress.    Appearance: She is well-developed. She is not ill-appearing.  HENT:     Head: Normocephalic.     Right Ear: Hearing normal.     Left Ear: Hearing normal.     Mouth/Throat:     Comments: Reports pain to back lower right jaw, unable to view, poor visual connection.  Externally lower jaw appears to have mild edema present. Eyes:     General: Lids are normal.        Right eye: No discharge.  Left eye: No discharge.     Conjunctiva/sclera: Conjunctivae normal.  Pulmonary:     Effort: Pulmonary effort is normal. No accessory muscle usage or respiratory distress.  Musculoskeletal:     Cervical back: Normal range of motion.  Neurological:     Mental Status: She is alert and oriented to person, place, and time.  Psychiatric:        Attention and Perception: Attention normal.        Mood and Affect: Mood normal.        Behavior: Behavior normal. Behavior  is cooperative.        Thought Content: Thought content normal.        Judgment: Judgment normal.     Results for orders placed or performed in visit on 02/08/19  Novel Coronavirus, NAA ([facility])   Specimen: Nasopharyngeal(NP) swabs in vial transport medium   NASOPHARYNGE  TESTING  Result Value Ref Range   SARS-CoV-2, NAA Not Detected Not Detected      Assessment & Plan:   Problem List Items Addressed This Visit      Digestive   Dental abscess - Primary    Acute, x 4 days with no improvement.  Will send in script for Augmentin for infection and viscose Lidocaine for pain.  Recommend alternating Tylenol and Ibuprofen as needed for discomfort + applying ice to area as needed.  Urgent referral placed to dental for further evaluation and treatment.  Return for worsening or ongoing symptoms.      Relevant Orders   Ambulatory referral to Dentistry      I discussed the assessment and treatment plan with the patient. The patient was provided an opportunity to ask questions and all were answered. The patient agreed with the plan and demonstrated an understanding of the instructions.   The patient was advised to call back or seek an in-person evaluation if the symptoms worsen or if the condition fails to improve as anticipated.   I provided 15+ minutes of time during this encounter.  Follow up plan: Return if symptoms worsen or fail to improve.

## 2019-04-22 NOTE — Assessment & Plan Note (Signed)
Acute, x 4 days with no improvement.  Will send in script for Augmentin for infection and viscose Lidocaine for pain.  Recommend alternating Tylenol and Ibuprofen as needed for discomfort + applying ice to area as needed.  Urgent referral placed to dental for further evaluation and treatment.  Return for worsening or ongoing symptoms.

## 2019-04-22 NOTE — Patient Instructions (Signed)
Dental Abscess  A dental abscess is an area of pus in or around a tooth. It comes from an infection. It can cause pain and other symptoms. Treatment will help with symptoms and prevent the infection from spreading. Follow these instructions at home: Medicines  Take over-the-counter and prescription medicines only as told by your dentist.  If you were prescribed an antibiotic medicine, take it as told by your dentist. Do not stop taking it even if you start to feel better.  If you were prescribed a gel that has numbing medicine in it, use it exactly as told.  Do not drive or use heavy machinery (like a lawn mower) while taking prescription pain medicine. General instructions  Rinse out your mouth often with salt water. ? To make salt water, dissolve -1 tsp of salt in 1 cup of warm water.  Eat a soft diet while your mouth is healing.  Drink enough fluid to keep your urine pale yellow.  Do not apply heat to the outside of your mouth.  Do not use any products that contain nicotine or tobacco. These include cigarettes and e-cigarettes. If you need help quitting, ask your doctor.  Keep all follow-up visits as told by your dentist. This is important. Prevent an abscess  Brush your teeth every morning and every night. Use fluoride toothpaste.  Floss your teeth each day.  Get dental cleanings as often as told by your dentist.  Think about getting dental sealant put on teeth that have deep holes (decay).  Drink water that has fluoride in it. ? Most tap water has fluoride. ? Check the label on bottled water to see if it has fluoride in it.  Drink water instead of sugary drinks.  Eat healthy meals and snacks.  Wear a mouth guard or face shield when you play sports. Contact a doctor if:  Your pain is worse, and medicine does not help. Get help right away if:  You have a fever or chills.  Your symptoms suddenly get worse.  You have a very bad headache.  You have problems  breathing or swallowing.  You have trouble opening your mouth.  You have swelling in your neck or close to your eye. Summary  A dental abscess is an area of pus in or around a tooth. It is caused by an infection.  Treatment will help with symptoms and prevent the infection from spreading.  Take over-the-counter and prescription medicines only as told by your dentist.  To prevent an abscess, take good care of your teeth. Brush your teeth every morning and night. Use floss every day.  Get dental cleanings as often as told by your dentist. This information is not intended to replace advice given to you by your health care provider. Make sure you discuss any questions you have with your health care provider. Document Revised: 05/20/2018 Document Reviewed: 09/30/2016 Elsevier Patient Education  2020 [employer]vier Inc.  

## 2019-05-02 ENCOUNTER — Other Ambulatory Visit: Payer: Self-pay | Admitting: Nurse Practitioner

## 2019-05-02 NOTE — Telephone Encounter (Signed)
Requested medications are due for refill today?  Uncertain if patient has been taking this medication.    Requested medications are on active medication list?  Yes  Last Refill:   04/28/2018   # 60 with 3 refills --   Future visit scheduled?  No  Notes to Clinic:   Office visit note on 07/07/2018 discussed making changes to patient's psych / anxiolytic medications.

## 2019-05-03 NOTE — Telephone Encounter (Signed)
Routing to provider  

## 2020-01-24 ENCOUNTER — Other Ambulatory Visit: Payer: Self-pay

## 2020-01-24 ENCOUNTER — Ambulatory Visit: Payer: Self-pay | Admitting: *Deleted

## 2020-01-24 ENCOUNTER — Emergency Department
Admission: EM | Admit: 2020-01-24 | Discharge: 2020-01-24 | Disposition: A | Discharge status: Home / Self Care | Payer: PRIVATE HEALTH INSURANCE | Attending: Emergency Medicine | Admitting: Emergency Medicine

## 2020-01-24 DIAGNOSIS — Z5321 Procedure and treatment not carried out due to patient leaving prior to being seen by health care provider: Secondary | ICD-10-CM | POA: Diagnosis not present

## 2020-01-24 DIAGNOSIS — R1030 Lower abdominal pain, unspecified: Secondary | ICD-10-CM | POA: Diagnosis present

## 2020-01-24 LAB — URINALYSIS, COMPLETE (UACMP) WITH MICROSCOPIC
Bacteria, UA: NONE SEEN
Bilirubin Urine: NEGATIVE
Glucose, UA: NEGATIVE mg/dL
Ketones, ur: NEGATIVE mg/dL
Leukocytes,Ua: NEGATIVE
Nitrite: NEGATIVE
Protein, ur: NEGATIVE mg/dL
Specific Gravity, Urine: 1.012 (ref 1.005–1.030)
pH: 6 (ref 5.0–8.0)

## 2020-01-24 LAB — COMPREHENSIVE METABOLIC PANEL
ALT: 14 U/L (ref 0–44)
AST: 15 U/L (ref 15–41)
Albumin: 4.2 g/dL (ref 3.5–5.0)
Alkaline Phosphatase: 42 U/L (ref 38–126)
Anion gap: 9 (ref 5–15)
BUN: 15 mg/dL (ref 6–20)
CO2: 24 mmol/L (ref 22–32)
Calcium: 9 mg/dL (ref 8.9–10.3)
Chloride: 103 mmol/L (ref 98–111)
Creatinine, Ser: 0.77 mg/dL (ref 0.44–1.00)
GFR, Estimated: >60 mL/min (ref >60)
Glucose, Bld: 102 mg/dL — ABNORMAL HIGH (ref 70–99)
Potassium: 3.9 mmol/L (ref 3.5–5.1)
Sodium: 136 mmol/L (ref 135–145)
Total Bilirubin: 0.5 mg/dL (ref 0.3–1.2)
Total Protein: 7.2 g/dL (ref 6.5–8.1)

## 2020-01-24 LAB — CBC
HCT: 42.2 % (ref 36.0–46.0)
Hemoglobin: 14.1 g/dL (ref 12.0–15.0)
MCH: 30.2 pg (ref 26.0–34.0)
MCHC: 33.4 g/dL (ref 30.0–36.0)
MCV: 90.4 fL (ref 80.0–100.0)
Platelets: 262 10*3/uL (ref 150–400)
RBC: 4.67 MIL/uL (ref 3.87–5.11)
RDW: 13.5 % (ref 11.5–15.5)
WBC: 9.1 10*3/uL (ref 4.0–10.5)
nRBC: 0 % (ref 0.0–0.2)

## 2020-01-24 NOTE — Telephone Encounter (Signed)
Patient is calling to report she has been having abdominal pain that goes in goes in severity but can be severe at times. Patient wants appointment Wednesday- she is off work then. Per protocol- can not schedule appointment due to disposition-be seen within 4 hours. Patient advised she needs to go to [facility]- ED for severe pain- she states she would like appointment- but if pain gets severe she will go. Note sent to office for PCP review of appointment request.  Reason for Disposition . [1] MILD-MODERATE pain AND [2] constant AND [3] present > 2 hours  Answer Assessment - Initial Assessment Questions 1. LOCATION: "Where does it hurt?"      Lower pelvic area- midline 2. RADIATION: "Does the pain shoot anywhere else?" (e.g., chest, back)     No radiation 3. ONSET: "When did the pain begin?" (e.g., minutes, hours or days ago)      2 weeks- yesterday was severe 4. SUDDEN: "Gradual or sudden onset?"     Sudden- sometime it is gradual 5. PATTERN "Does the pain come and go, or is it constant?"    - If constant: "Is it getting better, staying the same, or worsening?"      (Note: Constant means the pain never goes away completely; most serious pain is constant and it progresses)     - If intermittent: "How long does it last?" "Do you have pain now?"     (Note: Intermittent means the pain goes away completely between bouts)     Cramping- constant yesterday, today not as bad-pain present 6. SEVERITY: "How bad is the pain?"  (e.g., Scale 1-10; mild, moderate, or severe)   - MILD (1-3): doesn't interfere with normal activities, abdomen soft and not tender to touch    - MODERATE (4-7): interferes with normal activities or awakens from sleep, tender to touch    - SEVERE (8-10): excruciating pain, doubled over, unable to do any normal activities      Severe- 3-4 today, constant sharp- today more cramping 7. RECURRENT SYMPTOM: "Have you ever had this type of stomach pain before?" If Yes, ask: "When was the last  time?" and "What happened that time?"      Yes with cycles in the past 8. CAUSE: "What do you think is causing the stomach pain?"     Eating and drinking causes loose stool, pain not associated with eating 9. RELIEVING/AGGRAVATING FACTORS: "What makes it better or worse?" (e.g., movement, antacids, bowel movement)     Heat makes it feel better 10. OTHER SYMPTOMS: "Has there been any vomiting, diarrhea, constipation, or urine problems?"       Diarrhea, pain with urinary pressure 11. PREGNANCY: "Is there any chance you are pregnant?" "When was your last menstrual period?"       n/a  Protocols used: ABDOMINAL PAIN - [facility]

## 2020-01-24 NOTE — Telephone Encounter (Signed)
Scheduled with Dr Linwood Dibbles Thursday, pt's schedule changed

## 2020-01-24 NOTE — Telephone Encounter (Signed)
Dr Madelaine Etienne schedule is full as well for Wednesday

## 2020-01-24 NOTE — Telephone Encounter (Signed)
Okay with Wednesday appointment, please see if can be added to Dr. Linwood Dibbles schedule that day since I am full.

## 2020-01-24 NOTE — Telephone Encounter (Signed)
Noted, thank you

## 2020-01-24 NOTE — ED Triage Notes (Signed)
Pt in with co lower abd pain states started yesterday, describes it as cramping. No fever or dysuria.

## 2020-01-26 NOTE — Progress Notes (Deleted)
    SUBJECTIVE:   CHIEF COMPLAINT / HPI:   Past Medical History:  Diagnosis Date  . Allergy   . Anemia    when pregnant  . Anxiety   . Bipolar 1 disorder (HCC)   . Depression   . Migraines    ABDOMINAL PAIN   - seen in ED 12/13 with normal CMP, CBC. UA only notable for hematuria. Left w/o being seen.  Duration:2 weeks Onset: {Blank single:19197::"sudden","gradual"} Severity: {Blank single:19197::"mild","moderate","severe","1/10","2/10","3/10","4/10","5/10","6/10","7/10","8/10","9/10","10/10"} Quality: {Blank multiple:19196::"sharp","dull","aching","burning","cramping","ill-defined","itchy","pressure-like","pulling","shooting","sore","stabbing","tender","tearing","throbbing"} Location:  {Blank multiple:19196::"LUQ","RUQ","epigastric","peri-umbilical","LLQ","RLQ","diffuse","suprapubic". "lower abdominal quadrants"}  Episode duration:  Radiation: {Blank single:19197::"yes","no"} Frequency: {Blank single:19197::"constant","intermittent","occasional","rare","every few minutes","a few times a hour","a few times a day","a few times a week","a few times a month","a few times a year"} Alleviating factors:  Aggravating factors: Status: {Blank multiple:19196::"better","worse","stable","fluctuating"} Treatments attempted: {Blank multiple:19196::"none","antacids","PPI","H2 Blocker"} Fever: {Blank single:19197::"yes","no"} Nausea: {Blank single:19197::"yes","no"} Vomiting: {Blank single:19197::"yes","no"} Weight loss: {Blank single:19197::"yes","no"} Decreased appetite: {Blank single:19197::"yes","no"} Diarrhea: {Blank single:19197::"yes","no"} Constipation: {Blank single:19197::"yes","no"} Blood in stool: {Blank single:19197::"yes","no"} Heartburn: {Blank single:19197::"yes","no"} Jaundice: {Blank single:19197::"yes","no"} Rash: {Blank single:19197::"yes","no"} Dysuria/urinary frequency: {Blank single:19197::"yes","no"} Hematuria: {Blank single:19197::"yes","no"} History of sexually  transmitted disease: {Blank single:19197::"yes","no"} Recurrent NSAID use: {Blank single:19197::"yes","no"}  ***needs cpe  OBJECTIVE:   There were no vitals taken for this visit.  ***  ASSESSMENT/PLAN:   No problem-specific Assessment & Plan notes found for this encounter.     Caro Laroche, DO [facility]

## 2020-01-27 ENCOUNTER — Other Ambulatory Visit: Payer: Self-pay

## 2020-01-27 ENCOUNTER — Ambulatory Visit: Payer: PRIVATE HEALTH INSURANCE | Admitting: Family Medicine

## 2020-01-27 ENCOUNTER — Ambulatory Visit (INDEPENDENT_AMBULATORY_CARE_PROVIDER_SITE_OTHER): Payer: PRIVATE HEALTH INSURANCE | Admitting: Family Medicine

## 2020-01-27 ENCOUNTER — Encounter: Payer: Self-pay | Admitting: Family Medicine

## 2020-01-27 DIAGNOSIS — R1030 Lower abdominal pain, unspecified: Secondary | ICD-10-CM | POA: Diagnosis not present

## 2020-01-27 DIAGNOSIS — R109 Unspecified abdominal pain: Secondary | ICD-10-CM | POA: Insufficient documentation

## 2020-01-27 NOTE — Patient Instructions (Signed)
It was great to see you!  Our plans for today:  - Drink plenty of water (8 8oz glasses per day) and eat at least 3-4 servings of fruits/vegetables per day, preferably 5 servings.  - Mix one capful of miralax in a non-caffeinated drink (8oz) and drink in one sitting. - avoid caffeine as this can dehydrate you. - If you don't have a good, normal bowel movement in a few days, try an enema.  - Make an appointment soon for annual checkup.  - Come back in about 2-3 weeks if no better.   Take care and seek immediate care sooner if you develop any concerns.   Dr. Linwood Dibbles

## 2020-01-27 NOTE — Progress Notes (Signed)
   SUBJECTIVE:   CHIEF COMPLAINT / HPI:   Past Medical History:  Diagnosis Date  . Allergy   . Anemia    when pregnant  . Anxiety   . Bipolar 1 disorder (HCC)   . Depression   . Migraines     ABDOMINAL PAIN  - seen in ED 12/13 for same with normal CMP, CBC. UA only notable for hematuria. Left w/o being seen. - intermittent lower abdominal pain that comes and goes and relieved by BMs.  - has watery BM 2-3 times per day, very loose but alternating with hard pellets. - no blood in stool - drinks a few sips of water per day, mostly caffeine in coffee and sodas. Eats a lot of fast food, maybe one serving of fruits or vegetables per day.  - has been working long shifts at [employer]. No other identified stressors.  Duration:months Quality: sharp and cramping Location:  LLQ and suprapubic". "lower abdominal quadrants  Episode duration:  Radiation: no Frequency: intermittent Alleviating factors: BMs Aggravating factors: none Status: fluctuating Treatments attempted: none Fever: no Nausea: no Vomiting: no Weight loss: no Decreased appetite: no Diarrhea: yes Constipation: yes Blood in stool: no Heartburn: no Jaundice: no Rash: no Dysuria/urinary frequency: no Hematuria: no History of sexually transmitted disease: no  No abnormal vaginal discharge. LMP 2006, uterus absent.   OBJECTIVE:   BP 122/79   Pulse 89   Temp 97.8 F (36.6 C)   Wt 102 lb 6.4 oz (46.4 kg)   SpO2 99%   BMI 17.58 kg/m   Gen: well appearing, in NAD Abd: soft, TTP in LLQ and suprapubically. +BS. No organomegaly. No guarding or rebound tenderness. Negative murphy's sign. No tenderness over McBurney's point. No rashes or lesions.  ASSESSMENT/PLAN:   Abdominal pain Consistent with IBS. May also have underlying constipation given h/o hard pellet BMs, lack of adequate oral hydration and fiber intake with consumption of bowel irritant in caffeine. Recommend lifestyle changes including regular fiber  and water intake, limit caffeine.  F/u in 2-3 weeks if no better.   Caro Laroche, DO

## 2020-01-28 NOTE — Assessment & Plan Note (Signed)
Consistent with IBS. May also have underlying constipation given h/o hard pellet BMs, lack of adequate oral hydration and fiber intake with consumption of bowel irritant in caffeine. Recommend lifestyle changes including regular fiber and water intake, limit caffeine.  F/u in 2-3 weeks if no better.

## 2020-02-02 ENCOUNTER — Ambulatory Visit: Payer: PRIVATE HEALTH INSURANCE | Admitting: Family Medicine

## 2020-02-21 ENCOUNTER — Telehealth (INDEPENDENT_AMBULATORY_CARE_PROVIDER_SITE_OTHER): Payer: PRIVATE HEALTH INSURANCE | Admitting: Nurse Practitioner

## 2020-02-21 ENCOUNTER — Other Ambulatory Visit: Payer: Self-pay

## 2020-02-21 ENCOUNTER — Other Ambulatory Visit: Payer: Self-pay | Admitting: Nurse Practitioner

## 2020-02-21 ENCOUNTER — Encounter: Payer: Self-pay | Admitting: Nurse Practitioner

## 2020-02-21 DIAGNOSIS — J3489 Other specified disorders of nose and nasal sinuses: Secondary | ICD-10-CM | POA: Diagnosis not present

## 2020-02-21 MED ORDER — QUETIAPINE FUMARATE 50 MG PO TABS: 50.0000 mg | ORAL_TABLET | Freq: Every day | ORAL | 4 refills | Status: DC

## 2020-02-21 MED ORDER — PREDNISONE 20 MG PO TABS: 40.0000 mg | ORAL_TABLET | Freq: Every day | ORAL | 0 refills | Status: AC

## 2020-02-21 MED ORDER — BUSPIRONE HCL 5 MG PO TABS
5.0000 mg | ORAL_TABLET | Freq: Two times a day (BID) | ORAL | 4 refills | Status: AC
Start: 2020-02-21 — End: ?

## 2020-02-21 MED ORDER — FLUTICASONE PROPIONATE 50 MCG/ACT NA SUSP: 2.0000 | Freq: Every day | NASAL | 6 refills | Status: AC

## 2020-02-21 MED ORDER — HYDROXYZINE HCL 10 MG PO TABS: 5.0000 mg | ORAL_TABLET | Freq: Three times a day (TID) | ORAL | 3 refills | Status: AC | PRN

## 2020-02-21 MED ORDER — CETIRIZINE HCL 10 MG PO TABS
10.0000 mg | ORAL_TABLET | Freq: Every day | ORAL | 4 refills | Status: AC
Start: 2020-02-21 — End: ?

## 2020-02-21 NOTE — Assessment & Plan Note (Signed)
Acute with current symptoms since 02/11/20.  At this time due to increase Covid cases in community and unvaccinated patient, recommend Covid testing which will obtain at office today.  Recommend she continue OTC Dayquil and Nyquil.  Scripts for Prednisone and refills on Zyrtec sent in.  I would recommend starting Vitamin C 500 MG twice a day, Quercetin 250-500 MG twice a day, Zinc 75-100 MG daily, and Vitamin D3 2000-4000 units daily to help.  You may also use over the counter cold medications for symptom relief.  Ensure good hydration and rest.  Tylenol for fever if presents.  Stay quarantined for at least 10 days.  Return to office for worsening or ongoing symptoms.  If increased SOB or CP presents, please go immediately to ER.

## 2020-02-21 NOTE — Addendum Note (Signed)
Addended by: Enedina Finner on: 02/21/2020 11:31 AM   Modules accepted: Orders

## 2020-02-21 NOTE — Telephone Encounter (Signed)
   Notes to clinic: Looks like a different dose of this medication was sent to the pharmacy  Review for refill  Requested Prescriptions  Pending Prescriptions Disp Refills   predniSONE (DELTASONE) 20 MG tablet [Pharmacy Med Name: PREDNISONE 20MG  TABLETS] 10 tablet 0    Sig: TAKE 2 TABLETS(40 MG) BY MOUTH DAILY WITH BREAKFAST FOR 5 DAYS      Not Delegated - Endocrinology:  Oral Corticosteroids Failed - 02/21/2020 11:48 AM      Failed - This refill cannot be delegated      Passed - Last BP in normal range    BP Readings from Last 1 Encounters:  01/27/20 122/79          Passed - Valid encounter within last 6 months    Recent Outpatient Visits           Today Sinus drainage   [facility] [place], [place], NP   3 weeks ago Lower abdominal pain   [facility], DO   10 months ago Dental abscess   [facility] [place], [place], NP   1 year ago Acute nonintractable headache, unspecified headache type   [facility], [facility], Salley Hews   1 year ago Exposure to [place] Virus   [facility], [facility], NP

## 2020-02-21 NOTE — Progress Notes (Signed)
There were no vitals taken for this visit.   Subjective:    Patient ID: Ann Wilkerson, female    DOB: 03/31/1980, 40 y.o.   MRN: [id number]  HPI: Ann Wilkerson is a 40 y.o. female  Chief Complaint  Patient presents with  . Cough    Runny nose since 02-11-20    . This visit was completed via telephone due to the restrictions of the COVID-19 pandemic. All issues as above were discussed and addressed but no physical exam was performed. If it was felt that the patient should be evaluated in the office, they were directed there. The patient verbally consented to this visit. Patient was unable to complete an audio/visual visit due to Technical difficulties,Lack of internet. Due to the catastrophic nature of the COVID-19 pandemic, this visit was done through audio contact only. . Location of the patient: home . Location of the provider: work . Those involved with this call:  . Provider: Aura Dials, DNP . CMA: Wilhemena Durie, CMA . Front Desk/Registration: Harriet Pho  . Time spent on call: 21 minutes on the phone discussing health concerns. 15 minutes total spent in review of patient's record and preparation of their chart.  . I verified patient identity using two factors (patient name and date of birth). Patient consents verbally to being seen via telemedicine visit today.    UPPER RESPIRATORY TRACT INFECTION Started with symptoms around December 31st.  Currently not vaccinated.  Has been taking Tylenol Cold and Flu with benefit.  Denies loss of taste or smell.   Fever: no Cough: yes Shortness of breath: no Wheezing: no Chest pain: no Chest tightness: no Chest congestion: no Nasal congestion: yes Runny nose: yes Post nasal drip: yes Sneezing: yes Sore throat: yes Swollen glands: no Sinus pressure: yes Headache: no Face pain: no Toothache: no Ear pain: none Ear pressure: none Eyes red/itching:no Eye drainage/crusting: no  Vomiting: no Rash: no Fatigue: no Sick  contacts: no Strep contacts: no  Context: stable Recurrent sinusitis: no Relief with OTC cold/cough medications: yes  Treatments attempted: cold/sinus   Relevant past medical, surgical, family and social history reviewed and updated as indicated. Interim medical history since our last visit reviewed. Allergies and medications reviewed and updated.  Review of Systems  Constitutional: Positive for fatigue. Negative for activity change, appetite change, chills and fever.  HENT: Positive for congestion, postnasal drip, rhinorrhea, sinus pressure, sneezing and sore throat. Negative for ear discharge, ear pain, facial swelling, sinus pain and voice change.   Eyes: Negative for pain and visual disturbance.  Respiratory: Positive for cough. Negative for chest tightness, shortness of breath and wheezing.   Cardiovascular: Negative for chest pain, palpitations and leg swelling.  Gastrointestinal: Negative.   Endocrine: Negative.   Musculoskeletal: Negative for myalgias.  Neurological: Negative.   Psychiatric/Behavioral: Negative.     Per HPI unless specifically indicated above     Objective:    There were no vitals taken for this visit.  Wt Readings from Last 3 Encounters:  01/27/20 102 lb 6.4 oz (46.4 kg)  01/24/20 100 lb (45.4 kg)  02/04/19 107 lb (48.5 kg)    Physical Exam   Unable to perform due to telephone visit only.   Results for orders placed or performed during the hospital encounter of 01/24/20  CBC  Result Value Ref Range   WBC 9.1 4.0 - 10.5 K/uL   RBC 4.67 3.87 - 5.11 MIL/uL   Hemoglobin 14.1 12.0 - 15.0 g/dL   HCT 42.2  36.0 - 46.0 %   MCV 90.4 80.0 - 100.0 fL   MCH 30.2 26.0 - 34.0 pg   MCHC 33.4 30.0 - 36.0 g/dL   RDW 17.9 15.0 - 56.9 %   Platelets 262 150 - 400 K/uL   nRBC 0.0 0.0 - 0.2 %  Comprehensive metabolic panel  Result Value Ref Range   Sodium 136 135 - 145 mmol/L   Potassium 3.9 3.5 - 5.1 mmol/L   Chloride 103 98 - 111 mmol/L   CO2 24 22 - 32  mmol/L   Glucose, Bld 102 (H) 70 - 99 mg/dL   BUN 15 6 - 20 mg/dL   Creatinine, Ser 7.94 0.44 - 1.00 mg/dL   Calcium 9.0 8.9 - 80.1 mg/dL   Total Protein 7.2 6.5 - 8.1 g/dL   Albumin 4.2 3.5 - 5.0 g/dL   AST 15 15 - 41 U/L   ALT 14 0 - 44 U/L   Alkaline Phosphatase 42 38 - 126 U/L   Total Bilirubin 0.5 0.3 - 1.2 mg/dL   GFR, Estimated >65 >53 mL/min   Anion gap 9 5 - 15  Urinalysis, Complete w Microscopic  Result Value Ref Range   Color, Urine YELLOW (A) YELLOW   APPearance CLEAR (A) CLEAR   Specific Gravity, Urine 1.012 1.005 - 1.030   pH 6.0 5.0 - 8.0   Glucose, UA NEGATIVE NEGATIVE mg/dL   Hgb urine dipstick SMALL (A) NEGATIVE   Bilirubin Urine NEGATIVE NEGATIVE   Ketones, ur NEGATIVE NEGATIVE mg/dL   Protein, ur NEGATIVE NEGATIVE mg/dL   Nitrite NEGATIVE NEGATIVE   Leukocytes,Ua NEGATIVE NEGATIVE   RBC / HPF 0-5 0 - 5 RBC/hpf   WBC, UA 0-5 0 - 5 WBC/hpf   Bacteria, UA NONE SEEN NONE SEEN   Squamous Epithelial / LPF 0-5 0 - 5      Assessment & Plan:   Problem List Items Addressed This Visit      Other   Sinus drainage - Primary    Acute with current symptoms since 02/11/20.  At this time due to increase Covid cases in community and unvaccinated patient, recommend Covid testing which will obtain at office today.  Recommend she continue OTC Dayquil and Nyquil.  Scripts for Prednisone and refills on Zyrtec sent in.  I would recommend starting Vitamin C 500 MG twice a day, Quercetin 250-500 MG twice a day, Zinc 75-100 MG daily, and Vitamin D3 2000-4000 units daily to help.  You may also use over the counter cold medications for symptom relief.  Ensure good hydration and rest.  Tylenol for fever if presents.  Stay quarantined for at least 10 days.  Return to office for worsening or ongoing symptoms.  If increased SOB or CP presents, please go immediately to ER.      Relevant Orders   Novel Coronavirus, NAA ([facility])      I discussed the assessment and treatment plan with  the patient. The patient was provided an opportunity to ask questions and all were answered. The patient agreed with the plan and demonstrated an understanding of the instructions.   The patient was advised to call back or seek an in-person evaluation if the symptoms worsen or if the condition fails to improve as anticipated.   I provided 21+ minutes of time during this encounter.  Follow up plan: Return if symptoms worsen or fail to improve, for needs physical rescheduled.

## 2020-02-21 NOTE — Patient Instructions (Signed)

## 2020-02-23 LAB — NOVEL CORONAVIRUS, NAA: SARS-CoV-2, NAA: NOT DETECTED

## 2020-02-23 LAB — SARS-COV-2, NAA 2 DAY TAT

## 2020-02-23 NOTE — Progress Notes (Signed)
Contacted via MyChart   Good news, negative Covid testing.  Continue current treatment and reschedule physical:)

## 2020-03-23 ENCOUNTER — Other Ambulatory Visit: Payer: Self-pay

## 2020-03-23 ENCOUNTER — Encounter: Payer: Self-pay | Admitting: Nurse Practitioner

## 2020-03-23 ENCOUNTER — Ambulatory Visit (INDEPENDENT_AMBULATORY_CARE_PROVIDER_SITE_OTHER): Payer: PRIVATE HEALTH INSURANCE | Admitting: Nurse Practitioner

## 2020-03-23 VITALS — BP 98/68 | HR 84 | Temp 97.6°F | Ht 62.64 in | Wt 105.0 lb

## 2020-03-23 DIAGNOSIS — Z0000 Encounter for general adult medical examination without abnormal findings: Secondary | ICD-10-CM | POA: Diagnosis not present

## 2020-03-23 DIAGNOSIS — Z13220 Encounter for screening for lipoid disorders: Secondary | ICD-10-CM | POA: Diagnosis not present

## 2020-03-23 DIAGNOSIS — J301 Allergic rhinitis due to pollen: Secondary | ICD-10-CM

## 2020-03-23 DIAGNOSIS — Z1329 Encounter for screening for other suspected endocrine disorder: Secondary | ICD-10-CM

## 2020-03-23 DIAGNOSIS — F17210 Nicotine dependence, cigarettes, uncomplicated: Secondary | ICD-10-CM | POA: Diagnosis not present

## 2020-03-23 DIAGNOSIS — F319 Bipolar disorder, unspecified: Secondary | ICD-10-CM

## 2020-03-23 DIAGNOSIS — Z1159 Encounter for screening for other viral diseases: Secondary | ICD-10-CM | POA: Diagnosis not present

## 2020-03-23 MED ORDER — MONTELUKAST SODIUM 10 MG PO TABS: 10.0000 mg | ORAL_TABLET | Freq: Every day | ORAL | 4 refills | Status: AC

## 2020-03-23 MED ORDER — QUETIAPINE FUMARATE 50 MG PO TABS: 75.0000 mg | ORAL_TABLET | Freq: Every day | ORAL | 4 refills | Status: DC

## 2020-03-23 NOTE — Assessment & Plan Note (Signed)
Chronic, ongoing with failure of all OTC regimens, even with consistent use.  Will trial add on of Singulair, script sent for 10 MG QHS.  Educated her on medications + black box warning -- she is aware to notify provider immediately if any SI presents or any side effects.  Recommend avoiding triggers.  Return to office in 6 months for follow-up.  Consider ENT or allergist referral if ongoing.

## 2020-03-23 NOTE — Assessment & Plan Note (Signed)
Chronic, ongoing.  She does endorse difficulty with sleep continues, but overall mood stable.  Denies SI/HI.  Will trial increase in dose of Seroquel to 75 MG at HS, to aide in sleep and mood.  Continue Buspar daily and Vistaril as needed.  Refills sent in.  Return in 6 months for follow-up.

## 2020-03-23 NOTE — Patient Instructions (Signed)
Healthy Eating Following a healthy eating pattern may help you to achieve and maintain a healthy body weight, reduce the risk of chronic disease, and live a long and productive life. It is important to follow a healthy eating pattern at an appropriate calorie level for your body. Your nutritional needs should be met primarily through food by choosing a variety of nutrient-rich foods. What are tips for following this plan? Reading food labels  Read labels and choose the following: ? Reduced or low sodium. ? Juices with 100% fruit juice. ? Foods with low saturated fats and high polyunsaturated and monounsaturated fats. ? Foods with whole grains, such as whole wheat, cracked wheat, brown rice, and wild rice. ? Whole grains that are fortified with folic acid. This is recommended for women who are pregnant or who want to become pregnant.  Read labels and avoid the following: ? Foods with a lot of added sugars. These include foods that contain brown sugar, corn sweetener, corn syrup, dextrose, fructose, glucose, high-fructose corn syrup, honey, invert sugar, lactose, malt syrup, maltose, molasses, raw sugar, sucrose, trehalose, or turbinado sugar.  Do not eat more than the following amounts of added sugar per day:  6 teaspoons (25 g) for women.  9 teaspoons (38 g) for men. ? Foods that contain processed or refined starches and grains. ? Refined grain products, such as white flour, degermed cornmeal, white bread, and white rice. Shopping  Choose nutrient-rich snacks, such as vegetables, whole fruits, and nuts. Avoid high-calorie and high-sugar snacks, such as potato chips, fruit snacks, and candy.  Use oil-based dressings and spreads on foods instead of solid fats such as butter, stick margarine, or cream cheese.  Limit pre-made sauces, mixes, and "instant" products such as flavored rice, instant noodles, and ready-made pasta.  Try more plant-protein sources, such as tofu, tempeh, black beans,  edamame, lentils, nuts, and seeds.  Explore eating plans such as the Mediterranean diet or vegetarian diet. Cooking  Use oil to saut or stir-fry foods instead of solid fats such as butter, stick margarine, or lard.  Try baking, boiling, grilling, or broiling instead of frying.  Remove the fatty part of meats before cooking.  Steam vegetables in water or broth. Meal planning  At meals, imagine dividing your plate into fourths: ? One-half of your plate is fruits and vegetables. ? One-fourth of your plate is whole grains. ? One-fourth of your plate is protein, especially lean meats, poultry, eggs, tofu, beans, or nuts.  Include low-fat dairy as part of your daily diet.   Lifestyle  Choose healthy options in all settings, including home, work, school, restaurants, or stores.  Prepare your food safely: ? Wash your hands after handling raw meats. ? Keep food preparation surfaces clean by regularly washing with hot, soapy water. ? Keep raw meats separate from ready-to-eat foods, such as fruits and vegetables. ? Cook seafood, meat, poultry, and eggs to the recommended internal temperature. ? Store foods at safe temperatures. In general:  Keep cold foods at 7F (4.4C) or below.  Keep hot foods at 17F (60C) or above.  Keep your freezer at [facility] (-17.8C) or below.  Foods are no longer safe to eat when they have been between the temperatures of 40-17F (4.4-60C) for more than 2 hours. What foods should I eat? Fruits Aim to eat 2 cup-equivalents of fresh, canned (in natural juice), or frozen fruits each day. Examples of 1 cup-equivalent of fruit include 1 small apple, 8 large strawberries, 1 cup canned fruit,  cup dried fruit, or 1 cup 100% juice. Vegetables Aim to eat 2-3 cup-equivalents of fresh and frozen vegetables each day, including different varieties and colors. Examples of 1 cup-equivalent of vegetables include 2 medium carrots, 2 cups raw, leafy greens, 1 cup chopped  vegetable (raw or cooked), or 1 medium baked potato. Grains Aim to eat 6 ounce-equivalents of whole grains each day. Examples of 1 ounce-equivalent of grains include 1 slice of bread, 1 cup ready-to-eat cereal, 3 cups popcorn, or  cup cooked rice, pasta, or cereal. Meats and other proteins Aim to eat 5-6 ounce-equivalents of protein each day. Examples of 1 ounce-equivalent of protein include 1 egg, 1/2 cup nuts or seeds, or 1 tablespoon (16 g) peanut butter. A cut of meat or fish that is the size of a deck of cards is about 3-4 ounce-equivalents.  Of the protein you eat each week, try to have at least 8 ounces come from seafood. This includes salmon, trout, herring, and anchovies. Dairy Aim to eat 3 cup-equivalents of fat-free or low-fat dairy each day. Examples of 1 cup-equivalent of dairy include 1 cup (240 mL) milk, 8 ounces (250 g) yogurt, 1 ounces (44 g) natural cheese, or 1 cup (240 mL) fortified soy milk. Fats and oils  Aim for about 5 teaspoons (21 g) per day. Choose monounsaturated fats, such as canola and olive oils, avocados, peanut butter, and most nuts, or polyunsaturated fats, such as sunflower, corn, and soybean oils, walnuts, pine nuts, sesame seeds, sunflower seeds, and flaxseed. Beverages  Aim for six 8-oz glasses of water per day. Limit coffee to three to five 8-oz cups per day.  Limit caffeinated beverages that have added calories, such as soda and energy drinks.  Limit alcohol intake to no more than 1 drink a day for nonpregnant women and 2 drinks a day for men. One drink equals 12 oz of beer (355 mL), 5 oz of wine (148 mL), or 1 oz of hard liquor (44 mL). Seasoning and other foods  Avoid adding excess amounts of salt to your foods. Try flavoring foods with herbs and spices instead of salt.  Avoid adding sugar to foods.  Try using oil-based dressings, sauces, and spreads instead of solid fats. This information is based on general [place] nutrition guidelines. For more  information, visit choosemyplate.gov. Exact amounts may vary based on your nutrition needs. Summary  A healthy eating plan may help you to maintain a healthy weight, reduce the risk of chronic diseases, and stay active throughout your life.  Plan your meals. Make sure you eat the right portions of a variety of nutrient-rich foods.  Try baking, boiling, grilling, or broiling instead of frying.  Choose healthy options in all settings, including home, work, school, restaurants, or stores. This information is not intended to replace advice given to you by your health care provider. Make sure you discuss any questions you have with your health care provider. Document Revised: 05/12/2017 Document Reviewed: 05/12/2017 Elsevier Patient Education  2021 [employer] Inc.  

## 2020-03-23 NOTE — Assessment & Plan Note (Signed)
I have recommended complete cessation of tobacco use. I have discussed various options available for assistance with tobacco cessation including over the counter methods (Nicotine gum, patch and lozenges). We also discussed prescription options (Chantix, Nicotine Inhaler / Nasal Spray). The patient is not interested in pursuing any prescription tobacco cessation options at this time.  

## 2020-03-23 NOTE — Progress Notes (Signed)
BP 98/68   Pulse 84   Temp 97.6 F (36.4 C) (Oral)   Ht 5' 2.64" (1.591 m)   Wt 105 lb (47.6 kg)   SpO2 95%   BMI 18.82 kg/m    Subjective:    Patient ID: Ann LeavensLinda Wilkerson, female    DOB: 03/13/1980, 40 y.o.   MRN: [id number]  HPI: Ann Wilkerson is a 40 y.o. female presenting on 03/23/2020 for comprehensive medical examination. Current medical complaints include:none  She currently lives with: children Menopausal Symptoms: no  BIPOLAR DISORDER Continues Seroquel, Buspar, and Vistaril. Mood status: stable Satisfied with current treatment?: yes Symptom severity: mild  Duration of current treatment : chronic Side effects: no Medication compliance: good compliance Psychotherapy/counseling: none Previous psychiatric medications: Seroquel Depressed mood: no Anxious mood: no Anhedonia: no Significant weight loss or gain: no Insomnia: yes hard to fall asleep -- reports trouble sleeping and hard to fall asleep. Fatigue: no Feelings of worthlessness or guilt: no Impaired concentration/indecisiveness: no Suicidal ideations: no Hopelessness: no Crying spells: no [facility] 03/23/2020 02/21/2020 06/01/2018 04/28/2018  Decreased Interest 0 0 2 1  Down, Depressed, Hopeless 0 0 0 0  PHQ - 2 Score 0 0 2 1  Altered sleeping 3 - 3 1  Tired, decreased energy 3 - 3 3  Change in appetite 0 - 3 3  Feeling bad or failure about yourself  0 - 0 0  Trouble concentrating 3 - 0 0  Moving slowly or fidgety/restless 3 - 0 0  Suicidal thoughts 0 - 0 0  PHQ-9 Score 12 - 11 8  Difficult doing work/chores - - - Not difficult at all   ALLERGIES Continues to issues with allergies -- taking Zyrtec and Flonase daily with minimal benefit.  Continues to smoke 3-4 cigarettes a day and is attempting to cut back.  Has smoked since age 40. Duration: chronic Runny nose: yes "clear Nasal congestion: yes Nasal itching: yes Sneezing: no Eye swelling, itching or discharge: yes Post nasal drip:  yes Cough: yes Sinus pressure: no  Ear pain: none Ear pressure: none Fever: none Symptoms occur seasonally: no Symptoms occur perenially: yes Satisfied with current treatment: no Allergist evaluation in past: no Allergen injection immunotherapy: no Recurrent sinus infections: no ENT evaluation in past: no Known environmental allergy: no Indoor pets: yes History of asthma: no Current allergy medications: Zyrtec and Flonase Treatments attempted: multiple OTC medications  The patient does not have a history of falls. I did not complete a risk assessment for falls. A plan of care for falls was not documented.   Past Medical History:  Past Medical History:  Diagnosis Date  . Allergy   . Anemia    when pregnant  . Anxiety   . Bipolar 1 disorder (HCC)   . Depression   . Migraines     Surgical History:  Past Surgical History:  Procedure Laterality Date  . ABDOMINAL HYSTERECTOMY      Medications:  Current Outpatient Medications on File Prior to Visit  Medication Sig  . busPIRone (BUSPAR) 5 MG tablet Take 1 tablet (5 mg total) by mouth 2 (two) times daily.  . cetirizine (ZYRTEC) 10 MG tablet Take 1 tablet (10 mg total) by mouth daily.  . fluticasone (FLONASE) 50 MCG/ACT nasal spray Place 2 sprays into both nostrils daily.  . hydrOXYzine (ATARAX/VISTARIL) 10 MG tablet Take 0.5-1 tablets (5-10 mg total) by mouth 3 (three) times daily as needed.  . lidocaine (XYLOCAINE) 2 % solution Use as  directed 15 mLs in the mouth or throat as needed for mouth pain.  . Multiple Vitamin (MULTIVITAMINS PO) Take by mouth daily.   No current facility-administered medications on file prior to visit.    Allergies:  Allergies  Allergen Reactions  . Codeine   . Novocain [Procaine] Diarrhea and Nausea And Vomiting    Social History:  Social History   Socioeconomic History  . Marital status: Married    Spouse name: Not on file  . Number of children: Not on file  . Years of education:  Not on file  . Highest education level: Not on file  Occupational History  . Not on file  Tobacco Use  . Smoking status: Current Every Day Smoker    Packs/day: 0.25    Types: Cigarettes  . Smokeless tobacco: Never Used  Vaping Use  . Vaping Use: Never used  Substance and Sexual Activity  . Alcohol use: Not Currently  . Drug use: Not Currently  . Sexual activity: Yes  Other Topics Concern  . Not on file  Social History Narrative  . Not on file   Social Determinants of Health   Financial Resource Strain: Not on file  Food Insecurity: Not on file  Transportation Needs: Not on file  Physical Activity: Not on file  Stress: Not on file  Social Connections: Not on file  Intimate Partner Violence: Not on file   Social History   Tobacco Use  Smoking Status Current Every Day Smoker  . Packs/day: 0.25  . Types: Cigarettes  Smokeless Tobacco Never Used   Social History   Substance and Sexual Activity  Alcohol Use Not Currently    Family History:  Family History  Problem Relation Age of Onset  . Pancreatic disease Mother   . Pancreatitis Mother   . Alcohol abuse Mother   . Hypertension Father   . Diabetes Father   . Migraines Father   . Cataracts Father   . Alcohol abuse Brother   . Diabetes Paternal Grandmother   . Hypertension Paternal Grandmother   . Dementia Paternal Grandmother     Past medical history, surgical history, medications, allergies, family history and social history reviewed with patient today and changes made to appropriate areas of the chart.   ROS All other ROS negative except what is listed above and in the HPI.      Objective:    BP 98/68   Pulse 84   Temp 97.6 F (36.4 C) (Oral)   Ht 5' 2.64" (1.591 m)   Wt 105 lb (47.6 kg)   SpO2 95%   BMI 18.82 kg/m   Wt Readings from Last 3 Encounters:  03/23/20 105 lb (47.6 kg)  01/27/20 102 lb 6.4 oz (46.4 kg)  01/24/20 100 lb (45.4 kg)    Physical Exam Vitals and nursing note reviewed.   Constitutional:      General: She is awake. She is not in acute distress.    Appearance: She is well-developed and well-groomed. She is not ill-appearing.  HENT:     Head: Normocephalic and atraumatic.     Right Ear: Hearing, tympanic membrane, ear canal and external ear normal. No drainage.     Left Ear: Hearing, tympanic membrane, ear canal and external ear normal. No drainage.     Nose: Nose normal.     Right Sinus: No maxillary sinus tenderness or frontal sinus tenderness.     Left Sinus: No maxillary sinus tenderness or frontal sinus tenderness.  Mouth/Throat:     Mouth: Mucous membranes are moist.     Pharynx: Oropharynx is clear. Uvula midline. No pharyngeal swelling, oropharyngeal exudate or posterior oropharyngeal erythema.  Eyes:     General: Lids are normal.        Right eye: No discharge.        Left eye: No discharge.     Extraocular Movements: Extraocular movements intact.     Conjunctiva/sclera: Conjunctivae normal.     Pupils: Pupils are equal, round, and reactive to light.     Visual Fields: Right eye visual fields normal and left eye visual fields normal.  Neck:     Thyroid: No thyromegaly.     Vascular: No carotid bruit.     Trachea: Trachea normal.  Cardiovascular:     Rate and Rhythm: Normal rate and regular rhythm.     Heart sounds: Normal heart sounds. No murmur heard. No gallop.   Pulmonary:     Effort: Pulmonary effort is normal. No accessory muscle usage or respiratory distress.     Breath sounds: Normal breath sounds.  Chest:     Comments: Patient declined breast exam today Abdominal:     General: Bowel sounds are normal.     Palpations: Abdomen is soft. There is no hepatomegaly or splenomegaly.     Tenderness: There is no abdominal tenderness.  Musculoskeletal:        General: Normal range of motion.     Cervical back: Normal range of motion and neck supple.     Right lower leg: No edema.     Left lower leg: No edema.  Lymphadenopathy:      Head:     Right side of head: No submental, submandibular, tonsillar, preauricular or posterior auricular adenopathy.     Left side of head: No submental, submandibular, tonsillar, preauricular or posterior auricular adenopathy.     Cervical: No cervical adenopathy.  Skin:    General: Skin is warm and dry.     Capillary Refill: Capillary refill takes less than 2 seconds.     Findings: No rash.  Neurological:     Mental Status: She is alert and oriented to person, place, and time.     Cranial Nerves: Cranial nerves are intact.     Gait: Gait is intact.     Deep Tendon Reflexes: Reflexes are normal and symmetric.     Reflex Scores:      Brachioradialis reflexes are 2+ on the right side and 2+ on the left side.      Patellar reflexes are 2+ on the right side and 2+ on the left side. Psychiatric:        Attention and Perception: Attention normal.        Mood and Affect: Mood normal.        Speech: Speech normal.        Behavior: Behavior normal. Behavior is cooperative.        Thought Content: Thought content normal.        Judgment: Judgment normal.    Results for orders placed or performed in visit on 02/21/20  Novel Coronavirus, NAA ([facility])   Specimen: Nasopharyngeal(NP) swabs in vial transport medium  Result Value Ref Range   SARS-CoV-2, NAA Not Detected Not Detected  SARS-COV-2, NAA 2 DAY TAT  Result Value Ref Range   SARS-CoV-2, NAA 2 DAY TAT Performed       Assessment & Plan:   Problem List Items Addressed This Visit  Respiratory   Allergic rhinitis    Chronic, ongoing with failure of all OTC regimens, even with consistent use.  Will trial add on of Singulair, script sent for 10 MG QHS.  Educated her on medications + black box warning -- she is aware to notify provider immediately if any SI presents or any side effects.  Recommend avoiding triggers.  Return to office in 6 months for follow-up.  Consider ENT or allergist referral if ongoing.        Other    Nicotine dependence, cigarettes, uncomplicated    I have recommended complete cessation of tobacco use. I have discussed various options available for assistance with tobacco cessation including over the counter methods (Nicotine gum, patch and lozenges). We also discussed prescription options (Chantix, Nicotine Inhaler / Nasal Spray). The patient is not interested in pursuing any prescription tobacco cessation options at this time.       Bipolar I disorder (HCC) - Primary    Chronic, ongoing.  She does endorse difficulty with sleep continues, but overall mood stable.  Denies SI/HI.  Will trial increase in dose of Seroquel to 75 MG at HS, to aide in sleep and mood.  Continue Buspar daily and Vistaril as needed.  Refills sent in.  Return in 6 months for follow-up.       Other Visit Diagnoses    Encounter for annual physical exam       Annual labs today CBC, CMP, TSH, lipid   Relevant Orders   Lipid Profile   CBC with Differential   TSH   Comprehensive metabolic panel   Need for hepatitis C screening test       Hep C screening today   Relevant Orders   Hepatitis C antibody   Thyroid disorder screening       TSH on labs today   Relevant Orders   TSH   Screening cholesterol level       Lipid panel today   Relevant Orders   Lipid Profile       Follow up plan: Return in about 6 months (around 09/20/2020) for MOOD AND ALLERGIES.   LABORATORY TESTING:  - Pap smear: hysterectomy 2005 -- complete  IMMUNIZATIONS:   - Tdap: Tetanus vaccination status reviewed: refused. - Influenza: Refused - Pneumovax: Not applicable - Prevnar: Not applicable - HPV: Not applicable - Zostavax vaccine: Not applicable  - Covid vaccines -- refuses  SCREENING: -Mammogram: Not applicable  - Colonoscopy: Not applicable  - Bone Density: Not applicable  -Hearing Test: Not applicable  -Spirometry: Not applicable   PATIENT COUNSELING:   Advised to take 1 mg of folate supplement per day if capable of  pregnancy.   Sexuality: Discussed sexually transmitted diseases, partner selection, use of condoms, avoidance of unintended pregnancy  and contraceptive alternatives.   Advised to avoid cigarette smoking.  I discussed with the patient that most people either abstain from alcohol or drink within safe limits (<=14/week and <=4 drinks/occasion for males, <=7/weeks and <= 3 drinks/occasion for females) and that the risk for alcohol disorders and other health effects rises proportionally with the number of drinks per week and how often a drinker exceeds daily limits.  Discussed cessation/primary prevention of drug use and availability of treatment for abuse.   Diet: Encouraged to adjust caloric intake to maintain  or achieve ideal body weight, to reduce intake of dietary saturated fat and total fat, to limit sodium intake by avoiding high sodium foods and not adding table salt, and to  maintain adequate dietary potassium and calcium preferably from fresh fruits, vegetables, and low-fat dairy products.    Stressed the importance of regular exercise  Injury prevention: Discussed safety belts, safety helmets, smoke detector, smoking near bedding or upholstery.   Dental health: Discussed importance of regular tooth brushing, flossing, and dental visits.    NEXT PREVENTATIVE PHYSICAL DUE IN 1 YEAR. Return in about 6 months (around 09/20/2020) for MOOD AND ALLERGIES.

## 2020-03-24 LAB — CBC WITH DIFFERENTIAL/PLATELET
Basophils Absolute: 0.1 10*3/uL (ref 0.0–0.2)
Basos: 1 %
EOS (ABSOLUTE): 0.4 10*3/uL (ref 0.0–0.4)
Eos: 4 %
Hematocrit: 43.6 % (ref 34.0–46.6)
Hemoglobin: 14.6 g/dL (ref 11.1–15.9)
Immature Grans (Abs): 0 10*3/uL (ref 0.0–0.1)
Immature Granulocytes: 0 %
Lymphocytes Absolute: 2.2 10*3/uL (ref 0.7–3.1)
Lymphs: 27 %
MCH: 30.4 pg (ref 26.6–33.0)
MCHC: 33.5 g/dL (ref 31.5–35.7)
MCV: 91 fL (ref 79–97)
Monocytes Absolute: 0.7 10*3/uL (ref 0.1–0.9)
Monocytes: 8 %
Neutrophils Absolute: 4.9 10*3/uL (ref 1.4–7.0)
Neutrophils: 60 %
Platelets: 269 10*3/uL (ref 150–450)
RBC: 4.81 x10E6/uL (ref 3.77–5.28)
RDW: 12.9 % (ref 11.7–15.4)
WBC: 8.2 10*3/uL (ref 3.4–10.8)

## 2020-03-24 LAB — LIPID PANEL
Chol/HDL Ratio: 4.3 ratio (ref 0.0–4.4)
Cholesterol, Total: 200 mg/dL — ABNORMAL HIGH (ref 100–199)
HDL: 47 mg/dL (ref >39)
LDL Chol Calc (NIH): 134 mg/dL — ABNORMAL HIGH (ref 0–99)
Triglycerides: 106 mg/dL (ref 0–149)
VLDL Cholesterol Cal: 19 mg/dL (ref 5–40)

## 2020-03-24 LAB — COMPREHENSIVE METABOLIC PANEL
ALT: 12 IU/L (ref 0–32)
AST: 18 IU/L (ref 0–40)
Albumin/Globulin Ratio: 2.2 (ref 1.2–2.2)
Albumin: 4.3 g/dL (ref 3.8–4.8)
Alkaline Phosphatase: 53 IU/L (ref 44–121)
BUN/Creatinine Ratio: 13 (ref 9–23)
BUN: 12 mg/dL (ref 6–20)
Bilirubin Total: 0.5 mg/dL (ref 0.0–1.2)
CO2: 24 mmol/L (ref 20–29)
Calcium: 9.5 mg/dL (ref 8.7–10.2)
Chloride: 103 mmol/L (ref 96–106)
Creatinine, Ser: 0.95 mg/dL (ref 0.57–1.00)
GFR calc Af Amer: 87 mL/min/{1.73_m2} (ref >59)
GFR calc non Af Amer: 76 mL/min/{1.73_m2} (ref >59)
Globulin, Total: 2 g/dL (ref 1.5–4.5)
Glucose: 71 mg/dL (ref 65–99)
Potassium: 5.2 mmol/L (ref 3.5–5.2)
Sodium: 141 mmol/L (ref 134–144)
Total Protein: 6.3 g/dL (ref 6.0–8.5)

## 2020-03-24 LAB — TSH: TSH: 2.15 u[IU]/mL (ref 0.450–4.500)

## 2020-03-24 LAB — HEPATITIS C ANTIBODY: Hep C Virus Ab: <0.1 s/co ratio (ref 0.0–0.9)

## 2020-03-24 NOTE — Progress Notes (Signed)
Contacted via MyChart   Good afternoon Ayeshia, your labs have returned.  Overall they look great with exception of cholesterol levels.  Your cholesterol is still high, but continued recommendations to make lifestyle changes. Your LDL is above normal. The LDL is the bad cholesterol. Over time and in combination with inflammation and other factors, this contributes to plaque which in turn may lead to stroke and/or heart attack down the road. Sometimes high LDL is primarily genetic, and people might be eating all the right foods but still have high numbers. Other times, there is room for improvement in one's diet and eating healthier can bring this number down and potentially reduce one's risk of heart attack and/or stroke.   To reduce your LDL, Remember - more fruits and vegetables, more fish, and limit red meat and dairy products. More soy, nuts, beans, barley, lentils, oats and plant sterol ester enriched margarine instead of butter. I also encourage eliminating sugar and processed food. Remember, shop on the outside of the grocery store and visit your [employer]. If you would like to talk with me about dietary changes plus or minus medications for your cholesterol, please let me know. We should recheck your cholesterol in 12 months.  Keep being awesome!!  Thank you for allowing me to participate in your care. Kindest regards, Denver Harder

## 2020-09-17 ENCOUNTER — Encounter: Payer: Self-pay | Admitting: Nurse Practitioner

## 2020-09-17 DIAGNOSIS — E7800 Pure hypercholesterolemia, unspecified: Secondary | ICD-10-CM | POA: Insufficient documentation

## 2020-09-20 ENCOUNTER — Ambulatory Visit: Payer: PRIVATE HEALTH INSURANCE | Admitting: Nurse Practitioner

## 2020-09-25 ENCOUNTER — Encounter: Payer: Self-pay | Admitting: Family Medicine

## 2020-09-25 ENCOUNTER — Ambulatory Visit (INDEPENDENT_AMBULATORY_CARE_PROVIDER_SITE_OTHER): Payer: PRIVATE HEALTH INSURANCE | Admitting: Family Medicine

## 2020-09-25 ENCOUNTER — Other Ambulatory Visit: Payer: Self-pay

## 2020-09-25 VITALS — BP 104/72 | HR 93 | Temp 98.2°F | Ht 62.64 in | Wt 98.0 lb

## 2020-09-25 DIAGNOSIS — K047 Periapical abscess without sinus: Secondary | ICD-10-CM

## 2020-09-25 MED ORDER — AMOXICILLIN-POT CLAVULANATE 875-125 MG PO TABS: 1.0000 | ORAL_TABLET | Freq: Two times a day (BID) | ORAL | 0 refills | Status: DC

## 2020-09-25 NOTE — Progress Notes (Signed)
BP 104/72   Pulse 93   Temp 98.2 F (36.8 C) (Oral)   Ht 5' 2.64" (1.591 m)   Wt 98 lb (44.5 kg)   SpO2 99%   BMI 17.56 kg/m    Subjective:    Patient ID: Ann Wilkerson, female    DOB: 11-22-1980, 40 y.o.   MRN: [id number]  HPI: Ann Wilkerson is a 40 y.o. female  Chief Complaint  Patient presents with   Dental Pain    Left side of mouth, patient states that she has a tooth Abcess. For 3 weeks, tried taking some left over Amoxicillin which did not work   Dealer PAIN Duration: 3 weeks Involved teeth: left and lower Dentist evaluation: no Mechanism of injury:  unknown Onset: sudden Severity: moderate Quality: dull  Frequency: constant Radiation: no Aggravating factors: cold, heat, hard foods, and chewing Alleviating factors: nothing Status: worse Treatments attempted:  amoxicillin- has taken 2 pills, 500mg  Relief with NSAIDs?: mild Fevers: no Swelling: yes Redness: no Paresthesias / decreased sensation: no Sinus pressure: no  Relevant past medical, surgical, family and social history reviewed and updated as indicated. Interim medical history since our last visit reviewed. Allergies and medications reviewed and updated.  Review of Systems  Constitutional: Negative.   HENT:  Positive for dental problem and facial swelling. Negative for congestion, drooling, ear discharge, ear pain, hearing loss, mouth sores, nosebleeds, postnasal drip, rhinorrhea, sinus pressure, sinus pain, sneezing, sore throat, tinnitus, trouble swallowing and voice change.   Respiratory: Negative.    Cardiovascular: Negative.   Gastrointestinal: Negative.   Psychiatric/Behavioral: Negative.     Per HPI unless specifically indicated above     Objective:    BP 104/72   Pulse 93   Temp 98.2 F (36.8 C) (Oral)   Ht 5' 2.64" (1.591 m)   Wt 98 lb (44.5 kg)   SpO2 99%   BMI 17.56 kg/m   Wt Readings from Last 3 Encounters:  09/25/20 98 lb (44.5 kg)  03/23/20 105 lb (47.6 kg)  01/27/20  102 lb 6.4 oz (46.4 kg)    Physical Exam Vitals and nursing note reviewed.  Constitutional:      General: She is not in acute distress.    Appearance: Normal appearance. She is not ill-appearing, toxic-appearing or diaphoretic.  HENT:     Head: Normocephalic and atraumatic.     Comments: Swelling on L side of lower jaw    Right Ear: External ear normal.     Left Ear: External ear normal.     Nose: Nose normal.     Mouth/Throat:     Mouth: Mucous membranes are moist.     Pharynx: Oropharynx is clear.  Eyes:     General: No scleral icterus.       Right eye: No discharge.        Left eye: No discharge.     Extraocular Movements: Extraocular movements intact.     Conjunctiva/sclera: Conjunctivae normal.     Pupils: Pupils are equal, round, and reactive to light.  Cardiovascular:     Rate and Rhythm: Normal rate and regular rhythm.     Pulses: Normal pulses.     Heart sounds: Normal heart sounds. No murmur heard.   No friction rub. No gallop.  Pulmonary:     Effort: Pulmonary effort is normal. No respiratory distress.     Breath sounds: Normal breath sounds. No stridor. No wheezing, rhonchi or rales.  Chest:     Chest wall: No  tenderness.  Musculoskeletal:        General: Normal range of motion.     Cervical back: Normal range of motion and neck supple.  Skin:    General: Skin is warm and dry.     Capillary Refill: Capillary refill takes less than 2 seconds.     Coloration: Skin is not jaundiced or pale.     Findings: No bruising, erythema, lesion or rash.  Neurological:     General: No focal deficit present.     Mental Status: She is alert and oriented to person, place, and time. Mental status is at baseline.  Psychiatric:        Mood and Affect: Mood normal.        Behavior: Behavior normal.        Thought Content: Thought content normal.        Judgment: Judgment normal.    Results for orders placed or performed in visit on 03/23/20  Lipid Profile  Result Value Ref  Range   Cholesterol, Total 200 (H) 100 - 199 mg/dL   Triglycerides 287 0 - 149 mg/dL   HDL 47 >86 mg/dL   VLDL Cholesterol Cal 19 5 - 40 mg/dL   LDL Chol Calc (NIH) 767 (H) 0 - 99 mg/dL   Chol/HDL Ratio 4.3 0.0 - 4.4 ratio  CBC with Differential  Result Value Ref Range   WBC 8.2 3.4 - 10.8 x10E3/uL   RBC 4.81 3.77 - 5.28 x10E6/uL   Hemoglobin 14.6 11.1 - 15.9 g/dL   Hematocrit 20.9 47.0 - 46.6 %   MCV 91 79 - 97 fL   MCH 30.4 26.6 - 33.0 pg   MCHC 33.5 31.5 - 35.7 g/dL   RDW 96.2 83.6 - 62.9 %   Platelets 269 150 - 450 x10E3/uL   Neutrophils 60 Not Estab. %   Lymphs 27 Not Estab. %   Monocytes 8 Not Estab. %   Eos 4 Not Estab. %   Basos 1 Not Estab. %   Neutrophils Absolute 4.9 1.4 - 7.0 x10E3/uL   Lymphocytes Absolute 2.2 0.7 - 3.1 x10E3/uL   Monocytes Absolute 0.7 0.1 - 0.9 x10E3/uL   EOS (ABSOLUTE) 0.4 0.0 - 0.4 x10E3/uL   Basophils Absolute 0.1 0.0 - 0.2 x10E3/uL   Immature Granulocytes 0 Not Estab. %   Immature Grans (Abs) 0.0 0.0 - 0.1 x10E3/uL  TSH  Result Value Ref Range   TSH 2.150 0.450 - 4.500 uIU/mL  Hepatitis C antibody  Result Value Ref Range   Hep C Virus Ab <0.1 0.0 - 0.9 s/co ratio  Comprehensive metabolic panel  Result Value Ref Range   Glucose 71 65 - 99 mg/dL   BUN 12 6 - 20 mg/dL   Creatinine, Ser 4.76 0.57 - 1.00 mg/dL   GFR calc non Af Amer 76 >59 mL/min/1.73   GFR calc Af Amer 87 >59 mL/min/1.73   BUN/Creatinine Ratio 13 9 - 23   Sodium 141 134 - 144 mmol/L   Potassium 5.2 3.5 - 5.2 mmol/L   Chloride 103 96 - 106 mmol/L   CO2 24 20 - 29 mmol/L   Calcium 9.5 8.7 - 10.2 mg/dL   Total Protein 6.3 6.0 - 8.5 g/dL   Albumin 4.3 3.8 - 4.8 g/dL   Globulin, Total 2.0 1.5 - 4.5 g/dL   Albumin/Globulin Ratio 2.2 1.2 - 2.2   Bilirubin Total 0.5 0.0 - 1.2 mg/dL   Alkaline Phosphatase 53 44 - 121 IU/L   AST  18 0 - 40 IU/L   ALT 12 0 - 32 IU/L      Assessment & Plan:   Problem List Items Addressed This Visit   None Visit Diagnoses     Dental  abscess    -  Primary   Encouraged dentist evaluation. Will treat with augmentin. Advil 600mg  q6 hours. Call with any concerns.         Follow up plan: Return if symptoms worsen or fail to improve.

## 2020-10-04 ENCOUNTER — Encounter: Payer: Self-pay | Admitting: Nurse Practitioner

## 2020-10-04 ENCOUNTER — Other Ambulatory Visit: Payer: Self-pay

## 2020-10-04 ENCOUNTER — Ambulatory Visit (INDEPENDENT_AMBULATORY_CARE_PROVIDER_SITE_OTHER): Payer: PRIVATE HEALTH INSURANCE | Admitting: Nurse Practitioner

## 2020-10-04 VITALS — BP 92/52 | HR 73 | Temp 97.8°F | Ht 63.5 in | Wt 97.8 lb

## 2020-10-04 DIAGNOSIS — F17210 Nicotine dependence, cigarettes, uncomplicated: Secondary | ICD-10-CM

## 2020-10-04 DIAGNOSIS — F319 Bipolar disorder, unspecified: Secondary | ICD-10-CM

## 2020-10-04 MED ORDER — LORAZEPAM 0.5 MG PO TABS: 0.5000 mg | ORAL_TABLET | Freq: Every day | ORAL | 0 refills | Status: DC | PRN

## 2020-10-04 MED ORDER — OLANZAPINE 5 MG PO TABS: 5.0000 mg | ORAL_TABLET | Freq: Every day | ORAL | 8 refills | Status: AC

## 2020-10-04 MED ORDER — NICOTINE 21 MG/24HR TD PT24: 21.0000 mg | MEDICATED_PATCH | Freq: Every day | TRANSDERMAL | 0 refills | Status: AC

## 2020-10-04 MED ORDER — NICOTINE 14 MG/24HR TD PT24: 14.0000 mg | MEDICATED_PATCH | Freq: Every day | TRANSDERMAL | 0 refills | Status: DC

## 2020-10-04 NOTE — Assessment & Plan Note (Signed)
Chronic, ongoing with some exacerbation in anxiety due to family stressors.  Denies SI/HI.  Will stop Seroquel and start Olanzapine 5 MG, to help mood and sleep, may cause less lethargy -- increase dose as needed.  Discussed at length with patient, will send in #30 tablets of 0.5 MG Ativan to take daily AS NEEDED only, she is aware this is a temporary dosing and no refills will be provided -- is not long term.  Continue Buspar daily and Vistaril as needed.  Could consider trial of Latuda in future.

## 2020-10-04 NOTE — Assessment & Plan Note (Signed)
Wishes to quit, avoid Wellbutrin and Chantix due to concern for her mood.  Script for nicotine patches sent in, suspect if can improve anxiety then may be able to reduce cigarette intake.

## 2020-10-04 NOTE — Patient Instructions (Signed)
Managing the Challenge of Quitting Smoking Quitting smoking is a physical and mental challenge. You will face cravings, withdrawal symptoms, and temptation. Before quitting, work with your health care provider to make a plan that can help you manage quitting. Preparation canhelp you quit and keep you from giving in. How to manage lifestyle changes Managing stress Stress can make you want to smoke, and wanting to smoke may cause stress. It is important to find ways to manage your stress. You might try some of the following: Practice relaxation techniques. Breathe slowly and deeply, in through your nose and out through your mouth. Listen to music. Soak in a bath or take a shower. Imagine a peaceful place or vacation. Get some support. Talk with family or friends about your stress. Join a support group. Talk with a counselor or therapist. Get some physical activity. Go for a walk, run, or bike ride. Play a favorite sport. Practice yoga.  Medicines Talk with your health care provider about medicines that might help you dealwith cravings and make quitting easier for you. Relationships Social situations can be difficult when you are quitting smoking. To manage this, you can: Avoid parties and other social situations where people might be smoking. Avoid alcohol. Leave right away if you have the urge to smoke. Explain to your family and friends that you are quitting smoking. Ask for support and let them know you might be a bit grumpy. Plan activities where smoking is not an option. General instructions Be aware that many people gain weight after they quit smoking. However, not everyone does. To keep from gaining weight, have a plan in place before you quit and stick to the plan after you quit. Your plan should include: Having healthy snacks. When you have a craving, it may help to: Eat popcorn, carrots, celery, or other cut vegetables. Chew sugar-free gum. Changing how you eat. Eat small  portion sizes at meals. Eat 4-6 small meals throughout the day instead of 1-2 large meals a day. Be mindful when you eat. Do not watch television or do other things that might distract you as you eat. Exercising regularly. Make time to exercise each day. If you do not have time for a long workout, do short bouts of exercise for 5-10 minutes several times a day. Do some form of strengthening exercise, such as weight lifting. Do some exercise that gets your heart beating and causes you to breathe deeply, such as walking fast, running, swimming, or biking. This is very important. Drinking plenty of water or other low-calorie or no-calorie drinks. Drink 6-8 glasses of water daily.  How to recognize withdrawal symptoms Your body and mind may experience discomfort as you try to get used to not having nicotine in your system. These effects are called withdrawal symptoms. They may include: Feeling hungrier than normal. Having trouble concentrating. Feeling irritable or restless. Having trouble sleeping. Feeling depressed. Craving a cigarette. To manage withdrawal symptoms: Avoid places, people, and activities that trigger your cravings. Remember why you want to quit. Get plenty of sleep. Avoid coffee and other caffeinated drinks. These may worsen some of your symptoms. These symptoms may surprise you. But be assured that they are normal to havewhen quitting smoking. How to manage cravings Come up with a plan for how to deal with your cravings. The plan should include the following: A definition of the specific situation you want to deal with. An alternative action you will take. A clear idea for how this action will help. The   name of someone who might help you with this. Cravings usually last for 5-10 minutes. Consider taking the following actions to help you with your plan to deal with cravings: Keep your mouth busy. Chew sugar-free gum. Suck on hard candies or a straw. Brush your  teeth. Keep your hands and body busy. Change to a different activity right away. Squeeze or play with a ball. Do an activity or a hobby, such as making bead jewelry, practicing needlepoint, or working with wood. Mix up your normal routine. Take a short exercise break. Go for a quick walk or run up and down stairs. Focus on doing something kind or helpful for someone else. Call a friend or family member to talk during a craving. Join a support group. Contact a quitline. Where to find support To get help or find a support group: Call the National Cancer Institute's Smoking Quitline: 1-800-QUIT NOW (784-8669) Visit the website of the Substance Abuse and Mental Health Services Administration: www.samhsa.gov Text QUIT to SmokefreeTXT: 478848 Where to find more information Visit these websites to find more information on quitting smoking: National Cancer Institute: www.smokefree.gov American Lung Association: www.lung.org American Cancer Society: www.cancer.org Centers for Disease Control and Prevention: www.cdc.gov American Heart Association: www.heart.org Contact a health care provider if: You want to change your plan for quitting. The medicines you are taking are not helping. Your eating feels out of control or you cannot sleep. Get help right away if: You feel depressed or become very anxious. Summary Quitting smoking is a physical and mental challenge. You will face cravings, withdrawal symptoms, and temptation to smoke again. Preparation can help you as you go through these challenges. Try different techniques to manage stress, handle social situations, and prevent weight gain. You can deal with cravings by keeping your mouth busy (such as by chewing gum), keeping your hands and body busy, calling family or friends, or contacting a quitline for people who want to quit smoking. You can deal with withdrawal symptoms by avoiding places where people smoke, getting plenty of rest, and  avoiding drinks with caffeine. This information is not intended to replace advice given to you by your health care provider. Make sure you discuss any questions you have with your healthcare provider. Document Revised: 11/17/2018 Document Reviewed: 11/17/2018 Elsevier Patient Education  2022 [employer]  

## 2020-10-04 NOTE — Progress Notes (Signed)
BP (!) 92/52   Pulse 73   Temp 97.8 F (36.6 C) (Oral)   Ht 5' 3.5" (1.613 m)   Wt 97 lb 12.8 oz (44.4 kg)   SpO2 96%   BMI 17.05 kg/m    Subjective:    Patient ID: Ann Wilkerson, female    DOB: [birth year], 40 y.o.   MRN: [id number]  HPI: Ann Wilkerson is a 40 y.o. female  Chief Complaint  Patient presents with   Anxiety    Pt states she feels like she needs something stronger for her anxiety   Nicotine Dependence    Pt states she wants to discuss options to help her stop smoking   ANXIETY/STRESS Continues on Seroquel 75 MG at bedtime + Buspar 5 MG BID and Vistaril as needed.  Seroquel makes a little too sleepy.  Bipolar disorder diagnosis -- in past Zoloft, Paxil, Prozac.  At present her mother-in-law is not well, she has cancer and is currently on hospice.    She is trying to quit smoking -- but feels with anxiety is getting worse.  Is smoking 1/2 to 1 PPD.  Started smoking around 15 or 16.  Has tried patches and gum.   Duration:uncontrolled Anxious mood: yes  Excessive worrying: no Irritability: yes Sweating: no Nausea: no Palpitations:no Hyperventilation: no Panic attacks: yes Agoraphobia: no  Obscessions/compulsions: no Depressed mood: no Depression screen [facility] 2/9 10/04/2020 09/25/2020 03/23/2020 02/21/2020 06/01/2018  Decreased Interest 2 0 0 0 2  Down, Depressed, Hopeless 0 0 0 0 0  PHQ - 2 Score 2 0 0 0 2  Altered sleeping 1 0 3 - 3  Tired, decreased energy 1 0 3 - 3  Change in appetite 1 0 0 - 3  Feeling bad or failure about yourself  1 0 0 - 0  Trouble concentrating 0 0 3 - 0  Moving slowly or fidgety/restless 0 0 3 - 0  Suicidal thoughts 0 0 0 - 0  PHQ-9 Score 6 0 12 - 11  Difficult doing work/chores Somewhat difficult Not difficult at all - - -   Anhedonia: no Weight changes: no Insomnia: yes hard to fall asleep  Hypersomnia: no Fatigue/loss of energy: no Feelings of worthlessness: no Feelings of guilt: no Impaired concentration/indecisiveness:  no Suicidal ideations: no  Crying spells: no Recent Stressors/Life Changes: yes   Relationship problems: no   Family stress: yes     Financial stress: no    Job stress: no    Recent death/loss: no  GAD 7 : Generalized Anxiety Score 10/04/2020 06/01/2018 04/28/2018  Nervous, Anxious, on Edge 1 0 1  Control/stop worrying 0 0 1  Worry too much - different things 1 1 1   Trouble relaxing 1 3 2   Restless 1 3 2   Easily annoyed or irritable 1 1 2   Afraid - awful might happen 0 0 2  Total GAD 7 Score 5 8 11   Anxiety Difficulty Somewhat difficult - -    Relevant past medical, surgical, family and social history reviewed and updated as indicated. Interim medical history since our last visit reviewed. Allergies and medications reviewed and updated.  Review of Systems  Constitutional:  Negative for activity change, appetite change, diaphoresis, fatigue and fever.  Respiratory:  Negative for cough, chest tightness and shortness of breath.   Cardiovascular:  Negative for chest pain, palpitations and leg swelling.  Gastrointestinal: Negative.   Neurological: Negative.   Psychiatric/Behavioral:  Positive for decreased concentration and sleep disturbance. Negative for self-injury and  suicidal ideas. The patient is nervous/anxious.    Per HPI unless specifically indicated above     Objective:    BP (!) 92/52   Pulse 73   Temp 97.8 F (36.6 C) (Oral)   Ht 5' 3.5" (1.613 m)   Wt 97 lb 12.8 oz (44.4 kg)   SpO2 96%   BMI 17.05 kg/m   Wt Readings from Last 3 Encounters:  10/04/20 97 lb 12.8 oz (44.4 kg)  09/25/20 98 lb (44.5 kg)  03/23/20 105 lb (47.6 kg)    Physical Exam Vitals and nursing note reviewed.  Constitutional:      General: She is awake. She is not in acute distress.    Appearance: She is well-developed and well-groomed. She is not ill-appearing or toxic-appearing.  HENT:     Head: Normocephalic.     Right Ear: Hearing normal.     Left Ear: Hearing normal.  Eyes:      General: Lids are normal.        Right eye: No discharge.        Left eye: No discharge.     Conjunctiva/sclera: Conjunctivae normal.     Pupils: Pupils are equal, round, and reactive to light.  Neck:     Thyroid: No thyromegaly.     Vascular: No carotid bruit.  Cardiovascular:     Rate and Rhythm: Normal rate and regular rhythm.     Heart sounds: Normal heart sounds. No murmur heard.   No gallop.  Pulmonary:     Effort: Pulmonary effort is normal. No accessory muscle usage or respiratory distress.     Breath sounds: Normal breath sounds.  Abdominal:     General: Bowel sounds are normal.     Palpations: Abdomen is soft. There is no hepatomegaly or splenomegaly.  Musculoskeletal:     Cervical back: Normal range of motion and neck supple.     Right lower leg: No edema.     Left lower leg: No edema.  Lymphadenopathy:     Cervical: No cervical adenopathy.  Skin:    General: Skin is warm and dry.  Neurological:     Mental Status: She is alert and oriented to person, place, and time.  Psychiatric:        Attention and Perception: Attention normal.        Mood and Affect: Mood normal.        Speech: Speech normal.        Behavior: Behavior normal. Behavior is cooperative.        Thought Content: Thought content normal.   Results for orders placed or performed in visit on 03/23/20  Lipid Profile  Result Value Ref Range   Cholesterol, Total 200 (H) 100 - 199 mg/dL   Triglycerides 024 0 - 149 mg/dL   HDL 47 >09 mg/dL   VLDL Cholesterol Cal 19 5 - 40 mg/dL   LDL Chol Calc (NIH) 735 (H) 0 - 99 mg/dL   Chol/HDL Ratio 4.3 0.0 - 4.4 ratio  CBC with Differential  Result Value Ref Range   WBC 8.2 3.4 - 10.8 x10E3/uL   RBC 4.81 3.77 - 5.28 x10E6/uL   Hemoglobin 14.6 11.1 - 15.9 g/dL   Hematocrit 32.9 92.4 - 46.6 %   MCV 91 79 - 97 fL   MCH 30.4 26.6 - 33.0 pg   MCHC 33.5 31.5 - 35.7 g/dL   RDW 26.8 34.1 - 96.2 %   Platelets 269 150 - 450 x10E3/uL  Neutrophils 60 Not Estab. %    Lymphs 27 Not Estab. %   Monocytes 8 Not Estab. %   Eos 4 Not Estab. %   Basos 1 Not Estab. %   Neutrophils Absolute 4.9 1.4 - 7.0 x10E3/uL   Lymphocytes Absolute 2.2 0.7 - 3.1 x10E3/uL   Monocytes Absolute 0.7 0.1 - 0.9 x10E3/uL   EOS (ABSOLUTE) 0.4 0.0 - 0.4 x10E3/uL   Basophils Absolute 0.1 0.0 - 0.2 x10E3/uL   Immature Granulocytes 0 Not Estab. %   Immature Grans (Abs) 0.0 0.0 - 0.1 x10E3/uL  TSH  Result Value Ref Range   TSH 2.150 0.450 - 4.500 uIU/mL  Hepatitis C antibody  Result Value Ref Range   Hep C Virus Ab <0.1 0.0 - 0.9 s/co ratio  Comprehensive metabolic panel  Result Value Ref Range   Glucose 71 65 - 99 mg/dL   BUN 12 6 - 20 mg/dL   Creatinine, Ser 9.62 0.57 - 1.00 mg/dL   GFR calc non Af Amer 76 >59 mL/min/1.73   GFR calc Af Amer 87 >59 mL/min/1.73   BUN/Creatinine Ratio 13 9 - 23   Sodium 141 134 - 144 mmol/L   Potassium 5.2 3.5 - 5.2 mmol/L   Chloride 103 96 - 106 mmol/L   CO2 24 20 - 29 mmol/L   Calcium 9.5 8.7 - 10.2 mg/dL   Total Protein 6.3 6.0 - 8.5 g/dL   Albumin 4.3 3.8 - 4.8 g/dL   Globulin, Total 2.0 1.5 - 4.5 g/dL   Albumin/Globulin Ratio 2.2 1.2 - 2.2   Bilirubin Total 0.5 0.0 - 1.2 mg/dL   Alkaline Phosphatase 53 44 - 121 IU/L   AST 18 0 - 40 IU/L   ALT 12 0 - 32 IU/L      Assessment & Plan:   Problem List Items Addressed This Visit       Other   Nicotine dependence, cigarettes, uncomplicated    Wishes to quit, avoid Wellbutrin and Chantix due to concern for her mood.  Script for nicotine patches sent in, suspect if can improve anxiety then may be able to reduce cigarette intake.      Relevant Medications   nicotine (NICODERM CQ - DOSED IN MG/24 HOURS) 21 mg/24hr patch   nicotine (NICODERM CQ - DOSED IN MG/24 HOURS) 14 mg/24hr patch (Start on 11/15/2020)   Bipolar I disorder (HCC) - Primary    Chronic, ongoing with some exacerbation in anxiety due to family stressors.  Denies SI/HI.  Will stop Seroquel and start Olanzapine 5 MG, to  help mood and sleep, may cause less lethargy -- increase dose as needed.  Discussed at length with patient, will send in #30 tablets of 0.5 MG Ativan to take daily AS NEEDED only, she is aware this is a temporary dosing and no refills will be provided -- is not long term.  Continue Buspar daily and Vistaril as needed.  Could consider trial of Latuda in future.          Follow up plan: Return in about 6 months (around 03/26/2021) for Annual physical.

## 2020-11-08 ENCOUNTER — Encounter: Payer: Self-pay | Admitting: Nurse Practitioner

## 2020-11-08 ENCOUNTER — Other Ambulatory Visit: Payer: Self-pay

## 2020-11-08 ENCOUNTER — Ambulatory Visit (INDEPENDENT_AMBULATORY_CARE_PROVIDER_SITE_OTHER): Payer: PRIVATE HEALTH INSURANCE | Admitting: Nurse Practitioner

## 2020-11-08 VITALS — BP 104/72 | HR 80 | Temp 98.4°F | Wt 95.6 lb

## 2020-11-08 DIAGNOSIS — A5903 Trichomonal cystitis and urethritis: Secondary | ICD-10-CM | POA: Diagnosis not present

## 2020-11-08 DIAGNOSIS — R8281 Pyuria: Secondary | ICD-10-CM

## 2020-11-08 LAB — URINALYSIS, ROUTINE W REFLEX MICROSCOPIC: Specific Gravity, UA: 1.01 (ref 1.005–1.030)

## 2020-11-08 LAB — MICROSCOPIC EXAMINATION

## 2020-11-08 LAB — WET PREP FOR TRICH, YEAST, CLUE
Clue Cell Exam: NEGATIVE
Trichomonas Exam: POSITIVE — AB
Yeast Exam: NEGATIVE

## 2020-11-08 MED ORDER — METRONIDAZOLE 500 MG PO TABS: 500.0000 mg | ORAL_TABLET | Freq: Two times a day (BID) | ORAL | 0 refills | Status: AC

## 2020-11-08 NOTE — Patient Instructions (Signed)

## 2020-11-08 NOTE — Assessment & Plan Note (Signed)
Acute x one week -- will send urine for culture as poor reading on UA due to Azo use.  Wet prep return + for trich, but negative for yeast and BV.  At this time treat with Flagyl 500 MG BID x 7 days.  Educated patient at length on trichomonas and causes, she is going to further discuss with her partner.  Will send urine for gonorrhea and chlamydia screening as well, discussed with patient and she agrees with this plan.  Return if symptoms ongoing or worsening.

## 2020-11-08 NOTE — Progress Notes (Signed)
BP 104/72   Pulse 80   Temp 98.4 F (36.9 C) (Oral)   Wt 95 lb 9.6 oz (43.4 kg)   SpO2 98%   BMI 16.67 kg/m    Subjective:    Patient ID: Ann Wilkerson, female    DOB: 12/22/1980, 40 y.o.   MRN: [id number]  HPI: Ann Wilkerson is a 40 y.o. female  Chief Complaint  Patient presents with   Urinary Tract Infection   Vaginitis    Patient states last week, she has a lot of smelly gray-ish discharge and she started having itching in the vaginal area and then she started having lower abdominal pain. Patient states she has tried over the counter Monistat and states she was only during the day, which she states she did not allow it to rest inside the vaginal area overnight. Patient states she was informed by the pharmacy to try Azo to help.   URINARY SYMPTOMS Symptoms started last week = started with a little itching and odor.  Saturday she noted lower abdomen pressure. Dysuria: burning Urinary frequency: yes Urgency: yes Small volume voids: yes Symptom severity: yes Urinary incontinence: no Foul odor: yes Hematuria: no Abdominal pain: no Back pain: no Suprapubic pain/pressure: yes Flank pain: no Fever:  no Vomiting: no Relief with cranberry juice:  not taken Relief with pyridium: yes Status: worse Previous urinary tract infection: yes Recurrent urinary tract infection: no Sexual activity: monogamous History of sexually transmitted disease: yes chlamydia Treatments attempted: pyridium and increasing fluids    Relevant past medical, surgical, family and social history reviewed and updated as indicated. Interim medical history since our last visit reviewed. Allergies and medications reviewed and updated.  Review of Systems  Constitutional:  Negative for activity change, appetite change, chills, fatigue and fever.  Respiratory: Negative.    Cardiovascular: Negative.   Gastrointestinal:  Positive for abdominal pain. Negative for abdominal distention, diarrhea, nausea and  vomiting.  Genitourinary:  Positive for dyspareunia, dysuria, frequency, urgency and vaginal discharge. Negative for decreased urine volume and hematuria.  Neurological: Negative.   Psychiatric/Behavioral: Negative.     Per HPI unless specifically indicated above     Objective:    BP 104/72   Pulse 80   Temp 98.4 F (36.9 C) (Oral)   Wt 95 lb 9.6 oz (43.4 kg)   SpO2 98%   BMI 16.67 kg/m   Wt Readings from Last 3 Encounters:  11/08/20 95 lb 9.6 oz (43.4 kg)  10/04/20 97 lb 12.8 oz (44.4 kg)  09/25/20 98 lb (44.5 kg)    Physical Exam Vitals and nursing note reviewed.  Constitutional:      General: She is awake. She is not in acute distress.    Appearance: She is well-developed and well-groomed. She is not ill-appearing or toxic-appearing.  HENT:     Head: Normocephalic.     Right Ear: Hearing normal.     Left Ear: Hearing normal.  Eyes:     General: Lids are normal.        Right eye: No discharge.        Left eye: No discharge.     Conjunctiva/sclera: Conjunctivae normal.     Pupils: Pupils are equal, round, and reactive to light.  Neck:     Vascular: No carotid bruit.  Cardiovascular:     Rate and Rhythm: Normal rate and regular rhythm.     Heart sounds: Normal heart sounds. No murmur heard.   No gallop.  Pulmonary:  Effort: Pulmonary effort is normal. No accessory muscle usage or respiratory distress.     Breath sounds: Normal breath sounds.  Abdominal:     General: Bowel sounds are normal. There is no distension.     Palpations: Abdomen is soft.     Tenderness: There is abdominal tenderness in the suprapubic area. There is no right CVA tenderness or left CVA tenderness.  Musculoskeletal:     Cervical back: Normal range of motion and neck supple.     Right lower leg: No edema.     Left lower leg: No edema.  Skin:    General: Skin is warm and dry.  Neurological:     Mental Status: She is alert and oriented to person, place, and time.  Psychiatric:         Attention and Perception: Attention normal.        Mood and Affect: Mood normal.        Behavior: Behavior normal. Behavior is cooperative.        Thought Content: Thought content normal.        Judgment: Judgment normal.    Results for orders placed or performed in visit on 03/23/20  Lipid Profile  Result Value Ref Range   Cholesterol, Total 200 (H) 100 - 199 mg/dL   Triglycerides 875 0 - 149 mg/dL   HDL 47 >64 mg/dL   VLDL Cholesterol Cal 19 5 - 40 mg/dL   LDL Chol Calc (NIH) 332 (H) 0 - 99 mg/dL   Chol/HDL Ratio 4.3 0.0 - 4.4 ratio  CBC with Differential  Result Value Ref Range   WBC 8.2 3.4 - 10.8 x10E3/uL   RBC 4.81 3.77 - 5.28 x10E6/uL   Hemoglobin 14.6 11.1 - 15.9 g/dL   Hematocrit 95.1 88.4 - 46.6 %   MCV 91 79 - 97 fL   MCH 30.4 26.6 - 33.0 pg   MCHC 33.5 31.5 - 35.7 g/dL   RDW 16.6 06.3 - 01.6 %   Platelets 269 150 - 450 x10E3/uL   Neutrophils 60 Not Estab. %   Lymphs 27 Not Estab. %   Monocytes 8 Not Estab. %   Eos 4 Not Estab. %   Basos 1 Not Estab. %   Neutrophils Absolute 4.9 1.4 - 7.0 x10E3/uL   Lymphocytes Absolute 2.2 0.7 - 3.1 x10E3/uL   Monocytes Absolute 0.7 0.1 - 0.9 x10E3/uL   EOS (ABSOLUTE) 0.4 0.0 - 0.4 x10E3/uL   Basophils Absolute 0.1 0.0 - 0.2 x10E3/uL   Immature Granulocytes 0 Not Estab. %   Immature Grans (Abs) 0.0 0.0 - 0.1 x10E3/uL  TSH  Result Value Ref Range   TSH 2.150 0.450 - 4.500 uIU/mL  Hepatitis C antibody  Result Value Ref Range   Hep C Virus Ab <0.1 0.0 - 0.9 s/co ratio  Comprehensive metabolic panel  Result Value Ref Range   Glucose 71 65 - 99 mg/dL   BUN 12 6 - 20 mg/dL   Creatinine, Ser 0.10 0.57 - 1.00 mg/dL   GFR calc non Af Amer 76 >59 mL/min/1.73   GFR calc Af Amer 87 >59 mL/min/1.73   BUN/Creatinine Ratio 13 9 - 23   Sodium 141 134 - 144 mmol/L   Potassium 5.2 3.5 - 5.2 mmol/L   Chloride 103 96 - 106 mmol/L   CO2 24 20 - 29 mmol/L   Calcium 9.5 8.7 - 10.2 mg/dL   Total Protein 6.3 6.0 - 8.5 g/dL   Albumin 4.3  3.8 - 4.8  g/dL   Globulin, Total 2.0 1.5 - 4.5 g/dL   Albumin/Globulin Ratio 2.2 1.2 - 2.2   Bilirubin Total 0.5 0.0 - 1.2 mg/dL   Alkaline Phosphatase 53 44 - 121 IU/L   AST 18 0 - 40 IU/L   ALT 12 0 - 32 IU/L      Assessment & Plan:   Problem List Items Addressed This Visit       Genitourinary   Trichomonal cystitis - Primary    Acute x one week -- will send urine for culture as poor reading on UA due to Azo use.  Wet prep return + for trich, but negative for yeast and BV.  At this time treat with Flagyl 500 MG BID x 7 days.  Educated patient at length on trichomonas and causes, she is going to further discuss with her partner.  Will send urine for gonorrhea and chlamydia screening as well, discussed with patient and she agrees with this plan.  Return if symptoms ongoing or worsening.      Relevant Medications   metroNIDAZOLE (FLAGYL) 500 MG tablet   Other Relevant Orders   WET PREP FOR TRICH, YEAST, CLUE   Urinalysis, Routine w reflex microscopic   Urine Culture   GC/Chlamydia Probe Amp   Other Visit Diagnoses     Pyuria       Urine sent for culture.   Relevant Orders   Urine Culture        Follow up plan: Return if symptoms worsen or fail to improve.

## 2020-11-12 LAB — URINE CULTURE

## 2020-11-12 LAB — GC/CHLAMYDIA PROBE AMP
Chlamydia trachomatis, NAA: NEGATIVE
Neisseria Gonorrhoeae by PCR: NEGATIVE

## 2020-11-13 NOTE — Progress Notes (Signed)
Contacted via MyChart   Good morning, your gonorrhea and chlamydia testing has returned negative:)

## 2020-11-17 ENCOUNTER — Encounter: Payer: Self-pay | Admitting: Nurse Practitioner

## 2020-11-17 ENCOUNTER — Ambulatory Visit (INDEPENDENT_AMBULATORY_CARE_PROVIDER_SITE_OTHER): Payer: PRIVATE HEALTH INSURANCE | Admitting: Nurse Practitioner

## 2020-11-17 ENCOUNTER — Other Ambulatory Visit: Payer: Self-pay

## 2020-11-17 VITALS — BP 94/63 | HR 78 | Temp 98.8°F | Wt 97.8 lb

## 2020-11-17 DIAGNOSIS — A5903 Trichomonal cystitis and urethritis: Secondary | ICD-10-CM | POA: Diagnosis not present

## 2020-11-17 DIAGNOSIS — F17210 Nicotine dependence, cigarettes, uncomplicated: Secondary | ICD-10-CM

## 2020-11-17 DIAGNOSIS — Z23 Encounter for immunization: Secondary | ICD-10-CM | POA: Diagnosis not present

## 2020-11-17 LAB — URINALYSIS, ROUTINE W REFLEX MICROSCOPIC
Bilirubin, UA: NEGATIVE
Glucose, UA: NEGATIVE
Ketones, UA: NEGATIVE
Leukocytes,UA: NEGATIVE
Nitrite, UA: NEGATIVE
Protein,UA: NEGATIVE
RBC, UA: NEGATIVE
Specific Gravity, UA: 1.02 (ref 1.005–1.030)
Urobilinogen, Ur: 0.2 mg/dL (ref 0.2–1.0)
pH, UA: 6.5 (ref 5.0–7.5)

## 2020-11-17 LAB — WET PREP FOR TRICH, YEAST, CLUE
Clue Cell Exam: NEGATIVE
Trichomonas Exam: NEGATIVE
Yeast Exam: NEGATIVE

## 2020-11-17 MED ORDER — NICOTINE 10 MG IN INHA: 1.0000 | RESPIRATORY_TRACT | 4 refills | Status: DC | PRN

## 2020-11-17 NOTE — Progress Notes (Signed)
BP 94/63   Pulse 78   Temp 98.8 F (37.1 C) (Oral)   Wt 97 lb 12.8 oz (44.4 kg)   SpO2 97%   BMI 17.05 kg/m    Subjective:    Patient ID: Ann Wilkerson, female    DOB: 08/16/1980, 40 y.o.   MRN: [id number]  HPI: Ann Wilkerson is a 40 y.o. female  Chief Complaint  Patient presents with   Follow-up    Patient is here to follow up visit. Patient denies having any problems or concerns at today's visit.    Medication Problem    Patient states since her last visit when she was prescribed the patches to help with smoking. Patient has noticed that they are leaving marks on her arm and while they are on she says they itch. Patient states she does not know if she is allergic or if there is somewhere else she can use them on the upper body.    SMOKING CESSATION Started patches recently, but reports these are irritating the skin and leaving a rash.  She loves the patch and would prefer to continue a nicotine. Smoking Status: current Smoking Amount: 1-2 a day Smoking Onset: since she was 15 Smoking Quit Date: January 2023 Smoking triggers: coffee, driving, talking on phone, work Type of tobacco use: cigarettes Children in the house: yes Other household members who smoke: no Treatments attempted: patches Pneumovax: given today  URINARY SYMPTOMS Diagnosed on 11/08/20 with trichomoniasis and treated with Flagyl.  Remainder of labs were unremarkable and GC/Chlam was negative.  She presents today for follow-up on this.  Dysuria: none Urinary frequency:  improved Urgency: no Small volume voids: no Symptom severity: no Urinary incontinence: no Foul odor: no Hematuria: no Abdominal pain: no Back pain: no Suprapubic pain/pressure: no Flank pain: no Fever:  no Vomiting: no Status: better Previous urinary tract infection: yes Recurrent urinary tract infection: no Sexual activity: monogamous History of sexually transmitted disease: yes Treatments attempted: antibiotics    Relevant  past medical, surgical, family and social history reviewed and updated as indicated. Interim medical history since our last visit reviewed. Allergies and medications reviewed and updated.  Review of Systems  Constitutional:  Negative for activity change, appetite change, diaphoresis, fatigue and fever.  Respiratory:  Negative for cough, chest tightness and shortness of breath.   Cardiovascular:  Negative for chest pain, palpitations and leg swelling.  Gastrointestinal: Negative.   Neurological: Negative.   Psychiatric/Behavioral: Negative.     Per HPI unless specifically indicated above     Objective:    BP 94/63   Pulse 78   Temp 98.8 F (37.1 C) (Oral)   Wt 97 lb 12.8 oz (44.4 kg)   SpO2 97%   BMI 17.05 kg/m   Wt Readings from Last 3 Encounters:  11/17/20 97 lb 12.8 oz (44.4 kg)  11/08/20 95 lb 9.6 oz (43.4 kg)  10/04/20 97 lb 12.8 oz (44.4 kg)    Physical Exam Vitals and nursing note reviewed.  Constitutional:      General: She is awake. She is not in acute distress.    Appearance: She is well-developed and well-groomed. She is not ill-appearing or toxic-appearing.  HENT:     Head: Normocephalic.     Right Ear: Hearing normal.     Left Ear: Hearing normal.  Eyes:     General: Lids are normal.        Right eye: No discharge.        Left eye: No  discharge.     Conjunctiva/sclera: Conjunctivae normal.     Pupils: Pupils are equal, round, and reactive to light.  Neck:     Vascular: No carotid bruit.  Cardiovascular:     Rate and Rhythm: Normal rate and regular rhythm.     Heart sounds: Normal heart sounds. No murmur heard.   No gallop.  Pulmonary:     Effort: Pulmonary effort is normal. No accessory muscle usage or respiratory distress.     Breath sounds: Normal breath sounds.  Abdominal:     General: Bowel sounds are normal. There is no distension.     Palpations: Abdomen is soft.     Tenderness: There is no abdominal tenderness. There is no right CVA  tenderness or left CVA tenderness.  Musculoskeletal:     Cervical back: Normal range of motion and neck supple.     Right lower leg: No edema.     Left lower leg: No edema.  Skin:    General: Skin is warm and dry.  Neurological:     Mental Status: She is alert and oriented to person, place, and time.  Psychiatric:        Attention and Perception: Attention normal.        Mood and Affect: Mood normal.        Behavior: Behavior normal. Behavior is cooperative.        Thought Content: Thought content normal.        Judgment: Judgment normal.    Results for orders placed or performed in visit on 11/08/20  WET PREP FOR TRICH, YEAST, CLUE   Specimen: Sterile Swab   Sterile Swab  Result Value Ref Range   Trichomonas Exam Positive (A) Negative   Yeast Exam Negative Negative   Clue Cell Exam Negative Negative  Urine Culture   Specimen: Urine   UR  Result Value Ref Range   Urine Culture, Routine Final report    Organism ID, Bacteria Comment   GC/Chlamydia Probe Amp   Specimen: Urine   UR  Result Value Ref Range   Chlamydia trachomatis, NAA Negative Negative   Neisseria Gonorrhoeae by PCR Negative Negative  Microscopic Examination   Urine  Result Value Ref Range   WBC, UA 0-5 0 - 5 /hpf   RBC 0-2 0 - 2 /hpf   Epithelial Cells (non renal) 0-10 0 - 10 /hpf   Bacteria, UA Few (A) None seen/Few   Trichomonas, UA Present (A) None seen  Urinalysis, Routine w reflex microscopic  Result Value Ref Range   Specific Gravity, UA 1.010 1.005 - 1.030   pH, UA CANCELED    Color, UA Orange Yellow   Appearance Ur Cloudy (A) Clear   Protein,UA CANCELED    Glucose, UA CANCELED    Ketones, UA CANCELED    Microscopic Examination See below:       Assessment & Plan:   Problem List Items Addressed This Visit       Genitourinary   Trichomonal cystitis - Primary    Acute and negative on recheck today -- wet prep and UA negative.  Discussed with patient.      Relevant Orders   WET PREP  FOR TRICH, YEAST, CLUE   Urinalysis, Routine w reflex microscopic     Other   Nicotine dependence, cigarettes, uncomplicated    Wishes to quit, avoid Wellbutrin and Chantix due to concern for her mood. Nicotine patches causing irritation, will switch to inhaler as she is seeing benefit from  patches.      Relevant Medications   nicotine (NICOTROL) 10 MG inhaler   Other Visit Diagnoses     Pneumococcal vaccination given       PPSV23 discussed with patient and provided to patient in office today. Smoker.   Relevant Orders   Pneumococcal polysaccharide vaccine 23-valent greater than or equal to 2yo subcutaneous/IM        Follow up plan: Return in about 4 months (around 03/26/2021) for Annual physical .

## 2020-11-17 NOTE — Patient Instructions (Signed)
Pneumococcal Conjugate Vaccine: What You Need to Know 1. Why get vaccinated? Pneumococcal conjugate vaccine can prevent pneumococcal disease. Pneumococcal disease refers to any illness caused by pneumococcal bacteria. These bacteria can cause many types of illnesses, including pneumonia, which is an infection of the lungs. Pneumococcal bacteria are one of the most common causes of pneumonia. Besides pneumonia, pneumococcal bacteria can also cause: Ear infections Sinus infections Meningitis (infection of the tissue covering the brain and spinal cord) Bacteremia (infection of the blood) Anyone can get pneumococcal disease, but children under 2 years old, people with certain medical conditions or other risk factors, and adults 65 years or older are at the highest risk. Most pneumococcal infections are mild. However, some can result in long-term problems, such as brain damage or hearing loss. Meningitis, bacteremia, and pneumonia caused by pneumococcal disease can be fatal. 2. Pneumococcal conjugate vaccine Pneumococcal conjugate vaccine helps protect against bacteria that cause pneumococcal disease. There are three pneumococcal conjugate vaccines (PCV13, PCV15, and PCV20). The different vaccines are recommended for different people based on their age and medical status. PCV13 Infants and young children usually need 4 doses of PCV13, at ages 2, 4, 6, and 12-15 months. Older children (through age 59 months) may be vaccinated with PCV13 if they did not receive the recommended doses. Children and adolescents 6-18 years of age with certain medical conditions should receive a single dose of PCV13 if they did not already receive PCV13. PCV15 or PCV20 Adults 19 through 40 years old with certain medical conditions or other risk factors who have not already received a pneumococcal conjugate vaccine should receive either: a single dose of PCV15 followed by a dose of pneumococcal polysaccharide vaccine (PPSV23),  or a single dose of PCV20. Adults 65 years or older who have not already received a pneumococcal conjugate vaccine should receive either: a single dose of PCV15 followed by a dose of PPSV23, or a single dose of PCV20. Your health care provider can give you more information. 3. Talk with your health care provider Tell your vaccination provider if the person getting the vaccine: Has had an allergic reaction after a previous dose of any type of pneumococcal conjugate vaccine (PCV13, PCV15, PCV20, or an earlier pneumococcal conjugate vaccine known as PCV7), or to any vaccine containing diphtheria toxoid (for example, DTaP), or has any severe, life-threatening allergies In some cases, your health care provider may decide to postpone pneumococcal conjugate vaccination until a future visit. People with minor illnesses, such as a cold, may be vaccinated. People who are moderately or severely ill should usually wait until they recover. Your health care provider can give you more information. 4. Risks of a vaccine reaction Redness, swelling, pain, or tenderness where the shot is given, and fever, loss of appetite, fussiness (irritability), feeling tired, headache, muscle aches, joint pain, and chills can happen after pneumococcal conjugate vaccination. Young children may be at increased risk for seizures caused by fever after PCV13 if it is administered at the same time as inactivated influenza vaccine. Ask your health care provider for more information. People sometimes faint after medical procedures, including vaccination. Tell your provider if you feel dizzy or have vision changes or ringing in the ears. As with any medicine, there is a very remote chance of a vaccine causing a severe allergic reaction, other serious injury, or death. 5. What if there is a serious problem? An allergic reaction could occur after the vaccinated person leaves the clinic. If you see signs of a severe allergic   reaction (hives,  swelling of the face and throat, difficulty breathing, a fast heartbeat, dizziness, or weakness), call 9-1-1 and get the person to the nearest hospital. For other signs that concern you, call your health care provider. Adverse reactions should be reported to the Vaccine Adverse Event Reporting System (VAERS). Your health care provider will usually file this report, or you can do it yourself. Visit the VAERS website at www.vaers.hhs.gov or call 1-800-822-7967. VAERS is only for reporting reactions, and VAERS staff members do not give medical advice. 6. The National Vaccine Injury Compensation Program The National Vaccine Injury Compensation Program (VICP) is a federal program that was created to compensate people who may have been injured by certain vaccines. Claims regarding alleged injury or death due to vaccination have a time limit for filing, which may be as short as two years. Visit the VICP website at www.hrsa.gov/vaccinecompensation or call 1-800-338-2382 to learn about the program and about filing a claim. 7. How can I learn more? Ask your health care provider. Call your local or state health department. Visit the website of the Food and Drug Administration (FDA) for vaccine package inserts and additional information at www.fda.gov/vaccines-blood-biologics/vaccines. Contact the Centers for Disease Control and Prevention (CDC): Call 1-800-232-4636 (1-800-CDC-INFO) or Visit CDC's website at www.cdc.gov/vaccines. Vaccine Information Statement (Interim) Pneumococcal Conjugate Vaccine (03/17/2020) This information is not intended to replace advice given to you by your health care provider. Make sure you discuss any questions you have with your health care provider. Document Revised: 03/31/2020 Document Reviewed: 03/31/2020 Elsevier Patient Education  2022 [employer]  

## 2020-11-17 NOTE — Assessment & Plan Note (Signed)
Acute and negative on recheck today -- wet prep and UA negative.  Discussed with patient.

## 2020-11-17 NOTE — Assessment & Plan Note (Signed)
Wishes to quit, avoid Wellbutrin and Chantix due to concern for her mood. Nicotine patches causing irritation, will switch to inhaler as she is seeing benefit from patches.

## 2020-11-23 ENCOUNTER — Ambulatory Visit: Payer: Self-pay

## 2020-11-23 NOTE — Progress Notes (Signed)
Acute Office Visit  Subjective:    Patient ID: Ann Wilkerson, female    DOB: [birth year], 40 y.o.   MRN: [id number]  Chief Complaint  Patient presents with   Abdominal Pain    Pt states she started having abdominal pain and diarrhea after starting Flagyl 2 weeks ago. States it is better now, stools are becoming more solid than they were.     HPI Patient is in today for abdominal pain and diarrhea. She was on Flagyl 2 weeks ago for trichomonas infection. Her diarrhea and abdominal pain started 5 days ago.   ABDOMINAL PAIN   Duration:days - started Sunday (about 5 days) Onset: sudden Severity: mild Quality:  "knot pain" and cramping Location:  suprapubic". "lower abdominal quadrants  Episode duration:  Radiation: no Frequency: intermittent Alleviating factors: none Aggravating factors: eating, drinking Status: better Treatments attempted: none Fever: no Nausea: yes Vomiting: no Weight loss: no Decreased appetite: yes Diarrhea: yes Constipation: no Blood in stool: no Heartburn: yes Jaundice: no Rash: no Dysuria/urinary frequency: no Hematuria: no History of sexually transmitted disease: no Recurrent NSAID use: no   Past Medical History:  Diagnosis Date   Allergy    Anemia    when pregnant   Anxiety    Bipolar 1 disorder (HCC)    Depression    Migraines     Past Surgical History:  Procedure Laterality Date   ABDOMINAL HYSTERECTOMY      Family History  Problem Relation Age of Onset   Pancreatic disease Mother    Pancreatitis Mother    Alcohol abuse Mother    Hypertension Father    Diabetes Father    Migraines Father    Cataracts Father    Alcohol abuse Brother    Diabetes Paternal Grandmother    Hypertension Paternal Grandmother    Dementia Paternal Grandmother     Social History   Socioeconomic History   Marital status: Married    Spouse name: Not on file   Number of children: Not on file   Years of education: Not on file   Highest  education level: Not on file  Occupational History   Not on file  Tobacco Use   Smoking status: Every Day    Packs/day: 0.25    Types: Cigarettes   Smokeless tobacco: Never  Vaping Use   Vaping Use: Never used  Substance and Sexual Activity   Alcohol use: Not Currently   Drug use: Not Currently   Sexual activity: Yes  Other Topics Concern   Not on file  Social History Narrative   Not on file   Social Determinants of Health   Financial Resource Strain: Not on file  Food Insecurity: Not on file  Transportation Needs: Not on file  Physical Activity: Not on file  Stress: Not on file  Social Connections: Not on file  Intimate Partner Violence: Not on file    Outpatient Medications Prior to Visit  Medication Sig Dispense Refill   busPIRone (BUSPAR) 5 MG tablet Take 1 tablet (5 mg total) by mouth 2 (two) times daily. 180 tablet 4   cetirizine (ZYRTEC) 10 MG tablet Take 1 tablet (10 mg total) by mouth daily. 90 tablet 4   fluticasone (FLONASE) 50 MCG/ACT nasal spray Place 2 sprays into both nostrils daily. 16 g 6   hydrOXYzine (ATARAX/VISTARIL) 10 MG tablet Take 0.5-1 tablets (5-10 mg total) by mouth 3 (three) times daily as needed. 90 tablet 3   LORazepam (ATIVAN) 0.5 MG tablet Take 1  tablet (0.5 mg total) by mouth daily as needed for anxiety. Short supply only, to use for severe anxiety episodes only.  No refills. 30 tablet 0   montelukast (SINGULAIR) 10 MG tablet Take 1 tablet (10 mg total) by mouth at bedtime. 90 tablet 4   Multiple Vitamin (MULTIVITAMINS PO) Take by mouth daily.     OLANZapine (ZYPREXA) 5 MG tablet Take 1 tablet (5 mg total) by mouth at bedtime. If tolerating in one week, may increase to 10 MG (two tablets) by mouth every evening. 60 tablet 8   nicotine (NICOTROL) 10 MG inhaler Inhale 1 Cartridge (1 continuous puffing total) into the lungs as needed for smoking cessation. (Patient not taking: Reported on 11/24/2020) 42 each 4   No facility-administered  medications prior to visit.    Allergies  Allergen Reactions   Codeine    Novocain [Procaine] Diarrhea and Nausea And Vomiting    Review of Systems  Constitutional:  Positive for fatigue. Negative for chills and fever.  Respiratory: Negative.    Cardiovascular: Negative.   Gastrointestinal:  Positive for abdominal pain and diarrhea (getting better). Negative for blood in stool, constipation, nausea and vomiting.       Bloating  Genitourinary: Negative.   Skin: Negative.   Neurological: Negative.       Objective:    Physical Exam Vitals and nursing note reviewed.  Constitutional:      General: She is not in acute distress.    Appearance: Normal appearance.  HENT:     Head: Normocephalic.  Eyes:     Conjunctiva/sclera: Conjunctivae normal.  Cardiovascular:     Rate and Rhythm: Normal rate and regular rhythm.     Pulses: Normal pulses.     Heart sounds: Normal heart sounds.  Pulmonary:     Effort: Pulmonary effort is normal.     Breath sounds: Normal breath sounds.  Abdominal:     Palpations: Abdomen is soft. There is no mass.     Tenderness: There is abdominal tenderness (slight tenderness to LLQ). There is no guarding or rebound.  Musculoskeletal:     Cervical back: Normal range of motion.  Skin:    General: Skin is warm.     Comments: Good skin turgor  Neurological:     General: No focal deficit present.     Mental Status: She is alert and oriented to person, place, and time.  Psychiatric:        Mood and Affect: Mood normal.        Behavior: Behavior normal.        Thought Content: Thought content normal.        Judgment: Judgment normal.    BP 96/64   Pulse 88   Temp 97.8 F (36.6 C) (Oral)   Wt 98 lb 9.6 oz (44.7 kg)   SpO2 97%   BMI 17.19 kg/m  Wt Readings from Last 3 Encounters:  11/24/20 98 lb 9.6 oz (44.7 kg)  11/17/20 97 lb 12.8 oz (44.4 kg)  11/08/20 95 lb 9.6 oz (43.4 kg)    There are no preventive care reminders to display for this  patient.  There are no preventive care reminders to display for this patient.   Lab Results  Component Value Date   TSH 2.150 03/23/2020   Lab Results  Component Value Date   WBC 8.2 03/23/2020   HGB 14.6 03/23/2020   HCT 43.6 03/23/2020   MCV 91 03/23/2020   PLT 269 03/23/2020   Lab  Results  Component Value Date   NA 141 03/23/2020   K 5.2 03/23/2020   CO2 24 03/23/2020   GLUCOSE 71 03/23/2020   BUN 12 03/23/2020   CREATININE 0.95 03/23/2020   BILITOT 0.5 03/23/2020   ALKPHOS 53 03/23/2020   AST 18 03/23/2020   ALT 12 03/23/2020   PROT 6.3 03/23/2020   ALBUMIN 4.3 03/23/2020   CALCIUM 9.5 03/23/2020   ANIONGAP 9 01/24/2020   Lab Results  Component Value Date   CHOL 200 (H) 03/23/2020   Lab Results  Component Value Date   HDL 47 03/23/2020   Lab Results  Component Value Date   LDLCALC 134 (H) 03/23/2020   Lab Results  Component Value Date   TRIG 106 03/23/2020   Lab Results  Component Value Date   CHOLHDL 4.3 03/23/2020   No results found for: HGBA1C     Assessment & Plan:   Problem List Items Addressed This Visit       Other   Lower abdominal pain - Primary    Intermittent x5 days with diarrhea. Symptoms are improving. No red flags on exam. She states that now her son has the same symptoms. Most likely viral gastroenteritis. Increase fluids, continue bland diet. May eat yogurt to help as she was recently on flagyl. Follow up if symptoms worsen or don't improve over the next week.       Other Visit Diagnoses     Diarrhea, unspecified type       Improving. Increase fluids and continue bland diet. Can eat yogurt. F/U if symptoms worsen or don't improve         No orders of the defined types were placed in this encounter.    Gerre Scull, NP

## 2020-11-23 NOTE — Telephone Encounter (Signed)
Pt is scheduled tomorrow morning with Lauren.

## 2020-11-23 NOTE — Telephone Encounter (Signed)
Pt. States she was on an antibiotic last week. Started having abdominal cramping and diarrhea while on medication. Still having this week. Only wants to see her PCP. Appointment made for Monday by [facility] agent. Asking if she can be seen sooner. Please advise.  Answer Assessment - Initial Assessment Questions 1. LOCATION: "Where does it hurt?"      Lower abdomen 2. RADIATION: "Does the pain shoot anywhere else?" (e.g., chest, back)     No 3. ONSET: "When did the pain begin?" (e.g., minutes, hours or days ago)      Last week Gradual 4. SUDDEN: "Gradual or sudden onset?"     Gradual 5. PATTERN "Does the pain come and go, or is it constant?"    - If constant: "Is it getting better, staying the same, or worsening?"      (Note: Constant means the pain never goes away completely; most serious pain is constant and it progresses)     - If intermittent: "How long does it last?" "Do you have pain now?"     (Note: Intermittent means the pain goes away completely between bouts)     Comes and goes 6. SEVERITY: "How bad is the pain?"  (e.g., Scale 1-10; mild, moderate, or severe)   - MILD (1-3): doesn't interfere with normal activities, abdomen soft and not tender to touch    - MODERATE (4-7): interferes with normal activities or awakens from sleep, abdomen tender to touch    - SEVERE (8-10): excruciating pain, doubled over, unable to do any normal activities      Now - None 7. RECURRENT SYMPTOM: "Have you ever had this type of stomach pain before?" If Yes, ask: "When was the last time?" and "What happened that time?"      No 8. CAUSE: "What do you think is causing the stomach pain?"     Maybe because of antibiotic 9. RELIEVING/AGGRAVATING FACTORS: "What makes it better or worse?" (e.g., movement, antacids, bowel movement)     No 10. OTHER SYMPTOMS: "Do you have any other symptoms?" (e.g., back pain, diarrhea, fever, urination pain, vomiting)       Diarrhea 11. PREGNANCY: "Is there any chance you are  pregnant?" "When was your last menstrual period?"       No  Protocols used: Abdominal Pain - [facility]

## 2020-11-23 NOTE — Telephone Encounter (Signed)
Noted, wonderful.

## 2020-11-24 ENCOUNTER — Ambulatory Visit (INDEPENDENT_AMBULATORY_CARE_PROVIDER_SITE_OTHER): Payer: PRIVATE HEALTH INSURANCE | Admitting: Nurse Practitioner

## 2020-11-24 ENCOUNTER — Other Ambulatory Visit: Payer: Self-pay

## 2020-11-24 ENCOUNTER — Encounter: Payer: Self-pay | Admitting: Nurse Practitioner

## 2020-11-24 VITALS — BP 96/64 | HR 88 | Temp 97.8°F | Wt 98.6 lb

## 2020-11-24 DIAGNOSIS — R1030 Lower abdominal pain, unspecified: Secondary | ICD-10-CM

## 2020-11-24 DIAGNOSIS — R197 Diarrhea, unspecified: Secondary | ICD-10-CM | POA: Diagnosis not present

## 2020-11-24 NOTE — Assessment & Plan Note (Signed)
Intermittent x5 days with diarrhea. Symptoms are improving. No red flags on exam. She states that now her son has the same symptoms. Most likely viral gastroenteritis. Increase fluids, continue bland diet. May eat yogurt to help as she was recently on flagyl. Follow up if symptoms worsen or don't improve over the next week.

## 2020-11-27 ENCOUNTER — Telehealth: Payer: PRIVATE HEALTH INSURANCE | Admitting: Nurse Practitioner

## 2020-12-11 NOTE — Progress Notes (Deleted)
Acute Office Visit  Subjective:    Patient ID: Ann Wilkerson, female    DOB: May 20, 1980, 40 y.o.   MRN: [id number]  No chief complaint on file.   HPI Patient is in today for ***  Past Medical History:  Diagnosis Date   Allergy    Anemia    when pregnant   Anxiety    Bipolar 1 disorder (HCC)    Depression    Migraines     Past Surgical History:  Procedure Laterality Date   ABDOMINAL HYSTERECTOMY      Family History  Problem Relation Age of Onset   Pancreatic disease Mother    Pancreatitis Mother    Alcohol abuse Mother    Hypertension Father    Diabetes Father    Migraines Father    Cataracts Father    Alcohol abuse Brother    Diabetes Paternal Grandmother    Hypertension Paternal Grandmother    Dementia Paternal Grandmother     Social History   Socioeconomic History   Marital status: Married    Spouse name: Not on file   Number of children: Not on file   Years of education: Not on file   Highest education level: Not on file  Occupational History   Not on file  Tobacco Use   Smoking status: Every Day    Packs/day: 0.25    Types: Cigarettes   Smokeless tobacco: Never  Vaping Use   Vaping Use: Never used  Substance and Sexual Activity   Alcohol use: Not Currently   Drug use: Not Currently   Sexual activity: Yes  Other Topics Concern   Not on file  Social History Narrative   Not on file   Social Determinants of Health   Financial Resource Strain: Not on file  Food Insecurity: Not on file  Transportation Needs: Not on file  Physical Activity: Not on file  Stress: Not on file  Social Connections: Not on file  Intimate Partner Violence: Not on file    Outpatient Medications Prior to Visit  Medication Sig Dispense Refill   busPIRone (BUSPAR) 5 MG tablet Take 1 tablet (5 mg total) by mouth 2 (two) times daily. 180 tablet 4   cetirizine (ZYRTEC) 10 MG tablet Take 1 tablet (10 mg total) by mouth daily. 90 tablet 4   fluticasone (FLONASE) 50  MCG/ACT nasal spray Place 2 sprays into both nostrils daily. 16 g 6   hydrOXYzine (ATARAX/VISTARIL) 10 MG tablet Take 0.5-1 tablets (5-10 mg total) by mouth 3 (three) times daily as needed. 90 tablet 3   LORazepam (ATIVAN) 0.5 MG tablet Take 1 tablet (0.5 mg total) by mouth daily as needed for anxiety. Short supply only, to use for severe anxiety episodes only.  No refills. 30 tablet 0   montelukast (SINGULAIR) 10 MG tablet Take 1 tablet (10 mg total) by mouth at bedtime. 90 tablet 4   Multiple Vitamin (MULTIVITAMINS PO) Take by mouth daily.     nicotine (NICOTROL) 10 MG inhaler Inhale 1 Cartridge (1 continuous puffing total) into the lungs as needed for smoking cessation. (Patient not taking: Reported on 11/24/2020) 42 each 4   OLANZapine (ZYPREXA) 5 MG tablet Take 1 tablet (5 mg total) by mouth at bedtime. If tolerating in one week, may increase to 10 MG (two tablets) by mouth every evening. 60 tablet 8   No facility-administered medications prior to visit.    Allergies  Allergen Reactions   Codeine    Novocain [Procaine] Diarrhea and  Nausea And Vomiting    Review of Systems     Objective:    Physical Exam  There were no vitals taken for this visit. Wt Readings from Last 3 Encounters:  11/24/20 98 lb 9.6 oz (44.7 kg)  11/17/20 97 lb 12.8 oz (44.4 kg)  11/08/20 95 lb 9.6 oz (43.4 kg)    There are no preventive care reminders to display for this patient.  There are no preventive care reminders to display for this patient.   Lab Results  Component Value Date   TSH 2.150 03/23/2020   Lab Results  Component Value Date   WBC 8.2 03/23/2020   HGB 14.6 03/23/2020   HCT 43.6 03/23/2020   MCV 91 03/23/2020   PLT 269 03/23/2020   Lab Results  Component Value Date   NA 141 03/23/2020   K 5.2 03/23/2020   CO2 24 03/23/2020   GLUCOSE 71 03/23/2020   BUN 12 03/23/2020   CREATININE 0.95 03/23/2020   BILITOT 0.5 03/23/2020   ALKPHOS 53 03/23/2020   AST 18 03/23/2020   ALT  12 03/23/2020   PROT 6.3 03/23/2020   ALBUMIN 4.3 03/23/2020   CALCIUM 9.5 03/23/2020   ANIONGAP 9 01/24/2020   Lab Results  Component Value Date   CHOL 200 (H) 03/23/2020   Lab Results  Component Value Date   HDL 47 03/23/2020   Lab Results  Component Value Date   LDLCALC 134 (H) 03/23/2020   Lab Results  Component Value Date   TRIG 106 03/23/2020   Lab Results  Component Value Date   CHOLHDL 4.3 03/23/2020   No results found for: HGBA1C     Assessment & Plan:   Problem List Items Addressed This Visit   None    No orders of the defined types were placed in this encounter.    Gerre Scull, NP

## 2020-12-12 ENCOUNTER — Ambulatory Visit: Payer: PRIVATE HEALTH INSURANCE | Admitting: Nurse Practitioner

## 2020-12-12 NOTE — Progress Notes (Signed)
Acute Office Visit  Subjective:    Patient ID: Ann Wilkerson, female    DOB: [birth year], 40 y.o.   MRN: [id number]  Chief Complaint  Patient presents with   Vaginal Discharge   Vaginitis    Itchy/burning. Creamy/yellow Discharge with no odor, has no smell.Buddy Duty on Saturday. Tried OTC Monistat.      HPI Patient is in today for vaginal itching and discharge since Saturday.   VAGINAL DISCHARGE  Duration: days Discharge description: white  Pruritus: yes Dysuria: yes Malodorous: no Urinary frequency: no Fevers: no Abdominal pain: no  Sexual activity: monogamous History of sexually transmitted diseases: yes Recent antibiotic use: yes Context: worse  Treatments attempted: antifungal   Past Medical History:  Diagnosis Date   Allergy    Anemia    when pregnant   Anxiety    Bipolar 1 disorder ([place])    Depression    Migraines     Past Surgical History:  Procedure Laterality Date   ABDOMINAL HYSTERECTOMY      Family History  Problem Relation Age of Onset   Pancreatic disease Mother    Pancreatitis Mother    Alcohol abuse Mother    Hypertension Father    Diabetes Father    Migraines Father    Cataracts Father    Alcohol abuse Brother    Diabetes Paternal Grandmother    Hypertension Paternal Grandmother    Dementia Paternal Grandmother     Social History   Socioeconomic History   Marital status: Married    Spouse name: Not on file   Number of children: Not on file   Years of education: Not on file   Highest education level: Not on file  Occupational History   Not on file  Tobacco Use   Smoking status: Every Day    Packs/day: 0.25    Types: Cigarettes   Smokeless tobacco: Never  Vaping Use   Vaping Use: Never used  Substance and Sexual Activity   Alcohol use: Not Currently   Drug use: Not Currently   Sexual activity: Not Currently  Other Topics Concern   Not on file  Social History Narrative   Not on file   Social Determinants of Health    Financial Resource Strain: Not on file  Food Insecurity: Not on file  Transportation Needs: Not on file  Physical Activity: Not on file  Stress: Not on file  Social Connections: Not on file  Intimate Partner Violence: Not on file    Outpatient Medications Prior to Visit  Medication Sig Dispense Refill   busPIRone (BUSPAR) 5 MG tablet Take 1 tablet (5 mg total) by mouth 2 (two) times daily. 180 tablet 4   cetirizine (ZYRTEC) 10 MG tablet Take 1 tablet (10 mg total) by mouth daily. 90 tablet 4   fluticasone (FLONASE) 50 MCG/ACT nasal spray Place 2 sprays into both nostrils daily. 16 g 6   hydrOXYzine (ATARAX/VISTARIL) 10 MG tablet Take 0.5-1 tablets (5-10 mg total) by mouth 3 (three) times daily as needed. 90 tablet 3   LORazepam (ATIVAN) 0.5 MG tablet Take 1 tablet (0.5 mg total) by mouth daily as needed for anxiety. Short supply only, to use for severe anxiety episodes only.  No refills. 30 tablet 0   montelukast (SINGULAIR) 10 MG tablet Take 1 tablet (10 mg total) by mouth at bedtime. 90 tablet 4   Multiple Vitamin (MULTIVITAMINS PO) Take by mouth daily.     nicotine (NICOTROL) 10 MG inhaler Inhale 1 Cartridge (1  continuous puffing total) into the lungs as needed for smoking cessation. 42 each 4   OLANZapine (ZYPREXA) 5 MG tablet Take 1 tablet (5 mg total) by mouth at bedtime. If tolerating in one week, may increase to 10 MG (two tablets) by mouth every evening. 60 tablet 8   No facility-administered medications prior to visit.    Allergies  Allergen Reactions   Codeine    Novocain [Procaine] Diarrhea and Nausea And Vomiting    Review of Systems  Constitutional: Negative.   HENT: Negative.    Respiratory: Negative.    Cardiovascular: Negative.   Gastrointestinal: Negative.   Genitourinary:  Positive for dysuria and vaginal discharge. Negative for hematuria.  Musculoskeletal:  Positive for back pain.  Skin: Negative.   Neurological: Negative.       Objective:     Physical Exam Vitals and nursing note reviewed.  Constitutional:      General: She is not in acute distress.    Appearance: Normal appearance.  HENT:     Head: Normocephalic.  Eyes:     Conjunctiva/sclera: Conjunctivae normal.  Cardiovascular:     Rate and Rhythm: Normal rate.     Pulses: Normal pulses.  Pulmonary:     Effort: Pulmonary effort is normal.  Abdominal:     Palpations: Abdomen is soft.     Tenderness: There is no abdominal tenderness.  Musculoskeletal:     Cervical back: Normal range of motion.  Skin:    General: Skin is warm.  Neurological:     General: No focal deficit present.     Mental Status: She is alert and oriented to person, place, and time.  Psychiatric:        Mood and Affect: Mood normal.        Behavior: Behavior normal.        Thought Content: Thought content normal.        Judgment: Judgment normal.    BP 98/65   Pulse 81   Temp 98.2 F (36.8 C) (Oral)   Wt 98 lb 6 oz (44.6 kg)   SpO2 98%   BMI 17.15 kg/m  Wt Readings from Last 3 Encounters:  12/14/20 98 lb 6 oz (44.6 kg)  11/24/20 98 lb 9.6 oz (44.7 kg)  11/17/20 97 lb 12.8 oz (44.4 kg)    There are no preventive care reminders to display for this patient.  There are no preventive care reminders to display for this patient.   Lab Results  Component Value Date   TSH 2.150 03/23/2020   Lab Results  Component Value Date   WBC 8.2 03/23/2020   HGB 14.6 03/23/2020   HCT 43.6 03/23/2020   MCV 91 03/23/2020   PLT 269 03/23/2020   Lab Results  Component Value Date   NA 141 03/23/2020   K 5.2 03/23/2020   CO2 24 03/23/2020   GLUCOSE 71 03/23/2020   BUN 12 03/23/2020   CREATININE 0.95 03/23/2020   BILITOT 0.5 03/23/2020   ALKPHOS 53 03/23/2020   AST 18 03/23/2020   ALT 12 03/23/2020   PROT 6.3 03/23/2020   ALBUMIN 4.3 03/23/2020   CALCIUM 9.5 03/23/2020   ANIONGAP 9 01/24/2020   Lab Results  Component Value Date   CHOL 200 (H) 03/23/2020   Lab Results  Component  Value Date   HDL 47 03/23/2020   Lab Results  Component Value Date   LDLCALC 134 (H) 03/23/2020   Lab Results  Component Value Date   TRIG 106  03/23/2020   Lab Results  Component Value Date   CHOLHDL 4.3 03/23/2020   No results found for: HGBA1C     Assessment & Plan:   Problem List Items Addressed This Visit   None Visit Diagnoses     Vaginal itching    -  Primary   Wet prep positive for trichomonas, u/a positive for moderate bacteria, blood, and leukocytes.   Relevant Orders   Urinalysis, Routine w reflex microscopic   WET PREP FOR TRICH, YEAST, CLUE   Acute cystitis with hematuria       Will treat with macrobid, add on urine culture. Increase fluids   Relevant Orders   Urine Culture   Trichomonas infection       Will treat with flagyl. Encouraged partner to get tested and treated since this is recurrent episode. F/U if symptoms worsen or don't improve   Relevant Medications   metroNIDAZOLE (FLAGYL) 500 MG tablet   nitrofurantoin, macrocrystal-monohydrate, (MACROBID) 100 MG capsule        Meds ordered this encounter  Medications   metroNIDAZOLE (FLAGYL) 500 MG tablet    Sig: Take 1 tablet (500 mg total) by mouth 2 (two) times daily for 7 days.    Dispense:  14 tablet    Refill:  0   nitrofurantoin, macrocrystal-monohydrate, (MACROBID) 100 MG capsule    Sig: Take 1 capsule (100 mg total) by mouth 2 (two) times daily.    Dispense:  10 capsule    Refill:  0     Gerre Scull, NP

## 2020-12-14 ENCOUNTER — Encounter: Payer: Self-pay | Admitting: Nurse Practitioner

## 2020-12-14 ENCOUNTER — Other Ambulatory Visit: Payer: Self-pay

## 2020-12-14 ENCOUNTER — Ambulatory Visit (INDEPENDENT_AMBULATORY_CARE_PROVIDER_SITE_OTHER): Payer: PRIVATE HEALTH INSURANCE | Admitting: Nurse Practitioner

## 2020-12-14 VITALS — BP 98/65 | HR 81 | Temp 98.2°F | Wt 98.4 lb

## 2020-12-14 DIAGNOSIS — N898 Other specified noninflammatory disorders of vagina: Secondary | ICD-10-CM | POA: Diagnosis not present

## 2020-12-14 DIAGNOSIS — N3001 Acute cystitis with hematuria: Secondary | ICD-10-CM | POA: Diagnosis not present

## 2020-12-14 DIAGNOSIS — A599 Trichomoniasis, unspecified: Secondary | ICD-10-CM | POA: Diagnosis not present

## 2020-12-14 LAB — MICROSCOPIC EXAMINATION

## 2020-12-14 LAB — WET PREP FOR TRICH, YEAST, CLUE
Clue Cell Exam: NEGATIVE
Trichomonas Exam: POSITIVE — AB
Yeast Exam: NEGATIVE

## 2020-12-14 LAB — URINALYSIS, ROUTINE W REFLEX MICROSCOPIC
Bilirubin, UA: NEGATIVE
Glucose, UA: NEGATIVE
Ketones, UA: NEGATIVE
Nitrite, UA: NEGATIVE
Specific Gravity, UA: 1.02 (ref 1.005–1.030)
Urobilinogen, Ur: 0.2 mg/dL (ref 0.2–1.0)
pH, UA: 6 (ref 5.0–7.5)

## 2020-12-14 MED ORDER — METRONIDAZOLE 500 MG PO TABS: 500.0000 mg | ORAL_TABLET | Freq: Two times a day (BID) | ORAL | 0 refills | Status: AC

## 2020-12-14 MED ORDER — NITROFURANTOIN MONOHYD MACRO 100 MG PO CAPS: 100.0000 mg | ORAL_CAPSULE | Freq: Two times a day (BID) | ORAL | 0 refills | Status: DC

## 2020-12-20 LAB — URINE CULTURE

## 2020-12-26 ENCOUNTER — Ambulatory Visit: Payer: PRIVATE HEALTH INSURANCE | Admitting: Nurse Practitioner

## 2021-01-10 NOTE — Progress Notes (Signed)
Established Patient Office Visit  Subjective:  Patient ID: Ann Wilkerson, female    DOB: [birth year]  Age: 40 y.o. MRN: [id number]  CC:  Chief Complaint  Patient presents with   URI    Pt states she was exposed to the Flu last week. States she started having a headache, congestion, cough, and body aches. States symptoms started yesterday. Requesting work note as well. States she was out yesterday and today.     HPI [name] presents for congestion, cough, and body aches that started yesterday after exposure to flu.   UPPER RESPIRATORY TRACT INFECTION  Worst symptom: body aches Fever: no Cough: yes Shortness of breath: no Wheezing: no Chest pain: no Chest tightness: no Chest congestion: no Nasal congestion: yes Runny nose: yes Post nasal drip: no Sneezing: yes Sore throat: no Swollen glands: no Sinus pressure: yes Headache: yes Face pain: no Toothache: no Ear pain: no  Ear pressure: no  Eyes red/itching:yes Eye drainage/crusting: no  Vomiting: no Rash: no Fatigue: yes Sick contacts: yes Strep contacts: no  Context: worse Recurrent sinusitis: no Relief with OTC cold/cough medications: yes  Treatments attempted: theraflu, sudafed cold/flu    Past Medical History:  Diagnosis Date   Allergy    Anemia    when pregnant   Anxiety    Bipolar 1 disorder (HCC)    Depression    Migraines     Past Surgical History:  Procedure Laterality Date   TOTAL ABDOMINAL HYSTERECTOMY W/ BILATERAL SALPINGOOPHORECTOMY      Family History  Problem Relation Age of Onset   Pancreatic disease Mother    Pancreatitis Mother    Alcohol abuse Mother    Hypertension Father    Diabetes Father    Migraines Father    Cataracts Father    Alcohol abuse Brother    Diabetes Paternal Grandmother    Hypertension Paternal Grandmother    Dementia Paternal Grandmother     Social History   Socioeconomic History   Marital status: Married    Spouse name: Not on file   Number  of children: Not on file   Years of education: Not on file   Highest education level: Not on file  Occupational History   Not on file  Tobacco Use   Smoking status: Every Day    Packs/day: 0.25    Types: Cigarettes   Smokeless tobacco: Never  Vaping Use   Vaping Use: Never used  Substance and Sexual Activity   Alcohol use: Not Currently   Drug use: Not Currently   Sexual activity: Not Currently  Other Topics Concern   Not on file  Social History Narrative   Not on file   Social Determinants of Health   Financial Resource Strain: Not on file  Food Insecurity: Not on file  Transportation Needs: Not on file  Physical Activity: Not on file  Stress: Not on file  Social Connections: Not on file  Intimate Partner Violence: Not on file    Outpatient Medications Prior to Visit  Medication Sig Dispense Refill   busPIRone (BUSPAR) 5 MG tablet Take 1 tablet (5 mg total) by mouth 2 (two) times daily. 180 tablet 4   cetirizine (ZYRTEC) 10 MG tablet Take 1 tablet (10 mg total) by mouth daily. 90 tablet 4   fluticasone (FLONASE) 50 MCG/ACT nasal spray Place 2 sprays into both nostrils daily. 16 g 6   hydrOXYzine (ATARAX/VISTARIL) 10 MG tablet Take 0.5-1 tablets (5-10 mg total) by mouth 3 (three) times  daily as needed. 90 tablet 3   LORazepam (ATIVAN) 0.5 MG tablet Take 1 tablet (0.5 mg total) by mouth daily as needed for anxiety. Short supply only, to use for severe anxiety episodes only.  No refills. 30 tablet 0   montelukast (SINGULAIR) 10 MG tablet Take 1 tablet (10 mg total) by mouth at bedtime. 90 tablet 4   Multiple Vitamin (MULTIVITAMINS PO) Take by mouth daily.     OLANZapine (ZYPREXA) 5 MG tablet Take 1 tablet (5 mg total) by mouth at bedtime. If tolerating in one week, may increase to 10 MG (two tablets) by mouth every evening. 60 tablet 8   nicotine (NICOTROL) 10 MG inhaler Inhale 1 Cartridge (1 continuous puffing total) into the lungs as needed for smoking cessation. 42 each 4    nitrofurantoin, macrocrystal-monohydrate, (MACROBID) 100 MG capsule Take 1 capsule (100 mg total) by mouth 2 (two) times daily. 10 capsule 0   No facility-administered medications prior to visit.    Allergies  Allergen Reactions   Codeine    Novocain [Procaine] Diarrhea and Nausea And Vomiting    ROS Review of Systems  Constitutional:  Positive for fatigue. Negative for fever.  HENT:  Positive for congestion, postnasal drip, rhinorrhea, sinus pressure, sneezing and sore throat. Negative for ear pain.   Eyes:  Positive for itching. Negative for pain, discharge and redness.  Respiratory:  Positive for cough. Negative for shortness of breath.   Cardiovascular: Negative.   Gastrointestinal: Negative.   Endocrine: Negative.   Genitourinary: Negative.   Musculoskeletal:  Positive for myalgias.  Skin: Negative.   Neurological: Negative.   Psychiatric/Behavioral: Negative.       Objective:    Physical Exam Vitals and nursing note reviewed.  Constitutional:      General: She is not in acute distress.    Appearance: Normal appearance.  HENT:     Head: Normocephalic and atraumatic.  Eyes:     Conjunctiva/sclera: Conjunctivae normal.  Cardiovascular:     Rate and Rhythm: Normal rate and regular rhythm.     Pulses: Normal pulses.     Heart sounds: Normal heart sounds.  Pulmonary:     Effort: Pulmonary effort is normal.     Breath sounds: Normal breath sounds.  Musculoskeletal:     Cervical back: Normal range of motion and neck supple. No tenderness.  Lymphadenopathy:     Cervical: No cervical adenopathy.  Skin:    General: Skin is warm and dry.  Neurological:     General: No focal deficit present.     Mental Status: She is alert and oriented to person, place, and time.  Psychiatric:        Mood and Affect: Mood normal.        Behavior: Behavior normal.        Thought Content: Thought content normal.        Judgment: Judgment normal.    BP 97/63   Pulse 78   Temp  (!) 97.4 F (36.3 C) (Oral)   Wt 99 lb 6.4 oz (45.1 kg)   SpO2 96%   BMI 17.33 kg/m  Wt Readings from Last 3 Encounters:  01/11/21 99 lb 6.4 oz (45.1 kg)  12/14/20 98 lb 6 oz (44.6 kg)  11/24/20 98 lb 9.6 oz (44.7 kg)     There are no preventive care reminders to display for this patient.  There are no preventive care reminders to display for this patient.  Lab Results  Component Value Date  TSH 2.150 03/23/2020   Lab Results  Component Value Date   WBC 8.2 03/23/2020   HGB 14.6 03/23/2020   HCT 43.6 03/23/2020   MCV 91 03/23/2020   PLT 269 03/23/2020   Lab Results  Component Value Date   NA 141 03/23/2020   K 5.2 03/23/2020   CO2 24 03/23/2020   GLUCOSE 71 03/23/2020   BUN 12 03/23/2020   CREATININE 0.95 03/23/2020   BILITOT 0.5 03/23/2020   ALKPHOS 53 03/23/2020   AST 18 03/23/2020   ALT 12 03/23/2020   PROT 6.3 03/23/2020   ALBUMIN 4.3 03/23/2020   CALCIUM 9.5 03/23/2020   ANIONGAP 9 01/24/2020   Lab Results  Component Value Date   CHOL 200 (H) 03/23/2020   Lab Results  Component Value Date   HDL 47 03/23/2020   Lab Results  Component Value Date   LDLCALC 134 (H) 03/23/2020   Lab Results  Component Value Date   TRIG 106 03/23/2020   Lab Results  Component Value Date   CHOLHDL 4.3 03/23/2020   No results found for: HGBA1C    Assessment & Plan:   Problem List Items Addressed This Visit       Genitourinary   Trichomonal cystitis    Repeat U/A negative. No ongoing symptoms.       Relevant Medications   oseltamivir (TAMIFLU) 75 MG capsule   Other Relevant Orders   Urinalysis, Routine w reflex microscopic     Other   Nicotine dependence, cigarettes, uncomplicated    Smoking recently increased from 3 cigarettes to 5 cigarettes per day. She has not been able to get the nicotine inhaler and would like to be re-prescribed the patch. Nicoderm sent to the pharmacy.       Relevant Medications   nicotine (NICODERM CQ) 14 mg/24hr patch    Other Visit Diagnoses     Acute cough    -  Primary   Flu negative, covid-19 pending. With confirmed flu positive family members will treat with tamiflu. Cont. rest, fluids, sudofed prn. F/U if not improving.    Relevant Orders   Novel Coronavirus, NAA ([facility])   Veritor Flu A/B Waived       Meds ordered this encounter  Medications   nicotine (NICODERM CQ) 14 mg/24hr patch    Sig: Place 1 patch (14 mg total) onto the skin daily.    Dispense:  28 patch    Refill:  0   oseltamivir (TAMIFLU) 75 MG capsule    Sig: Take 1 capsule (75 mg total) by mouth 2 (two) times daily.    Dispense:  10 capsule    Refill:  0     Follow-up: Return if symptoms worsen or fail to improve.    Gerre Scull, NP

## 2021-01-11 ENCOUNTER — Ambulatory Visit (INDEPENDENT_AMBULATORY_CARE_PROVIDER_SITE_OTHER): Payer: PRIVATE HEALTH INSURANCE | Admitting: Nurse Practitioner

## 2021-01-11 ENCOUNTER — Encounter: Payer: Self-pay | Admitting: Nurse Practitioner

## 2021-01-11 ENCOUNTER — Other Ambulatory Visit: Payer: Self-pay

## 2021-01-11 VITALS — BP 97/63 | HR 78 | Temp 97.4°F | Wt 99.4 lb

## 2021-01-11 DIAGNOSIS — F17210 Nicotine dependence, cigarettes, uncomplicated: Secondary | ICD-10-CM

## 2021-01-11 DIAGNOSIS — A5903 Trichomonal cystitis and urethritis: Secondary | ICD-10-CM | POA: Diagnosis not present

## 2021-01-11 DIAGNOSIS — R051 Acute cough: Secondary | ICD-10-CM

## 2021-01-11 LAB — URINALYSIS, ROUTINE W REFLEX MICROSCOPIC
Bilirubin, UA: NEGATIVE
Glucose, UA: NEGATIVE
Ketones, UA: NEGATIVE
Leukocytes,UA: NEGATIVE
Nitrite, UA: NEGATIVE
Protein,UA: NEGATIVE
RBC, UA: NEGATIVE
Specific Gravity, UA: >1.03 — ABNORMAL HIGH (ref 1.005–1.030)
Urobilinogen, Ur: 0.2 mg/dL (ref 0.2–1.0)
pH, UA: 5.5 (ref 5.0–7.5)

## 2021-01-11 LAB — VERITOR FLU A/B WAIVED
Influenza A: NEGATIVE
Influenza B: NEGATIVE

## 2021-01-11 MED ORDER — NICOTINE 14 MG/24HR TD PT24: 14.0000 mg | MEDICATED_PATCH | Freq: Every day | TRANSDERMAL | 0 refills | Status: AC

## 2021-01-11 MED ORDER — OSELTAMIVIR PHOSPHATE 75 MG PO CAPS: 75.0000 mg | ORAL_CAPSULE | Freq: Two times a day (BID) | ORAL | 0 refills | Status: DC

## 2021-01-11 NOTE — Assessment & Plan Note (Signed)
Smoking recently increased from 3 cigarettes to 5 cigarettes per day. She has not been able to get the nicotine inhaler and would like to be re-prescribed the patch. Nicoderm sent to the pharmacy.

## 2021-01-11 NOTE — Assessment & Plan Note (Signed)
Repeat U/A negative. No ongoing symptoms.

## 2021-01-12 LAB — NOVEL CORONAVIRUS, NAA: SARS-CoV-2, NAA: NOT DETECTED

## 2021-02-21 ENCOUNTER — Ambulatory Visit (INDEPENDENT_AMBULATORY_CARE_PROVIDER_SITE_OTHER): Payer: PRIVATE HEALTH INSURANCE | Admitting: Nurse Practitioner

## 2021-02-21 ENCOUNTER — Other Ambulatory Visit: Payer: Self-pay

## 2021-02-21 ENCOUNTER — Encounter: Payer: Self-pay | Admitting: Nurse Practitioner

## 2021-02-21 VITALS — BP 102/69 | HR 83 | Temp 98.2°F | Ht 63.5 in | Wt 98.4 lb

## 2021-02-21 DIAGNOSIS — B9789 Other viral agents as the cause of diseases classified elsewhere: Secondary | ICD-10-CM | POA: Insufficient documentation

## 2021-02-21 DIAGNOSIS — J028 Acute pharyngitis due to other specified organisms: Secondary | ICD-10-CM

## 2021-02-21 MED ORDER — PREDNISONE 20 MG PO TABS: 40.0000 mg | ORAL_TABLET | Freq: Every day | ORAL | 0 refills | Status: AC

## 2021-02-21 MED ORDER — LIDOCAINE VISCOUS HCL 2 % MT SOLN: 15.0000 mL | OROMUCOSAL | 0 refills | Status: DC | PRN

## 2021-02-21 NOTE — Assessment & Plan Note (Signed)
Ongoing symptoms since after Christmas, going on 2 weeks, however these are improving. Family had been around someone with mono, but no direct exposure.  At this time she wishes to hold off on testing for this.  Will hold off on Covid and flu testing too, as recently got over the flu at beginning of December.  Suspect more viral upper respiratory and she reports is improving.  Will send in Prednisone 40 MG daily x 5 days and Viscous Lidocaine.  Recommend: - Increased rest - Increasing Fluids - Acetaminophen / ibuprofen as needed for fever/pain.  - Salt water gargling, chloraseptic spray and throat lozenges - Mucinex.  - Saline sinus flushes or a neti pot.  Return for worsening or ongoing symptoms.

## 2021-02-21 NOTE — Patient Instructions (Signed)

## 2021-02-21 NOTE — Progress Notes (Signed)
BP 102/69    Pulse 83    Temp 98.2 F (36.8 C) (Oral)    Ht 5' 3.5" (1.613 m)    Wt 98 lb 6.4 oz (44.6 kg)    SpO2 98%    BMI 17.16 kg/m    Subjective:    Patient ID: Ann Wilkerson, female    DOB: 1980-10-08, 41 y.o.   MRN: [id number]  HPI: Ann Wilkerson is a 41 y.o. female  Chief Complaint  Patient presents with   Exposed to Sickness    Patient states her brother in law was diagnosed with Mono around Christmas. Patient denies anyone drinking and eat after her brother in law. Patient states her husband has been having sore throat, runny nose, coughing and fatigue for the past 3 weeks. Patient states she is currently just paranoid and wants to discuss with provider the symptoms of Mono as she was told that you can catch it airborne.    EXPOSURE TO ILLNESS Over Christmas her brother-in-law was sick with Mono, however no family members shared cups or utensils with him.  Then after Christmas her family started getting sick with sore throat, congestion, fatigue, and drainage.  At this time patient reports still having drainage and sneezing, but overall improved symptoms. Fever: no Cough: yes -- this has improved Shortness of breath: no Wheezing: no Chest pain: no Chest tightness: no Chest congestion: no Nasal congestion: yes Runny nose: yes Post nasal drip: yes Sneezing: yes Sore throat: yes Swollen glands: no Sinus pressure: no Headache: yes Face pain: no Toothache: no Ear pain: none Ear pressure: none Eyes red/itching:no Eye drainage/crusting: no  Vomiting: no Rash: no Fatigue: yes Sick contacts: yes Strep contacts: no  Context: stable Recurrent sinusitis: no Relief with OTC cold/cough medications: yes  Treatments attempted: Mucinex Day/Night, Ibuprofen, Sudafed  Relevant past medical, surgical, family and social history reviewed and updated as indicated. Interim medical history since our last visit reviewed. Allergies and medications reviewed and updated.  Review of  Systems  Constitutional:  Positive for fatigue. Negative for activity change, appetite change, chills and fever.  HENT:  Positive for congestion, postnasal drip, rhinorrhea and sore throat. Negative for ear discharge, ear pain, facial swelling, sinus pressure, sinus pain, sneezing and voice change.   Respiratory:  Positive for cough. Negative for chest tightness, shortness of breath and wheezing.   Cardiovascular:  Negative for chest pain, palpitations and leg swelling.  Gastrointestinal: Negative.   Neurological:  Positive for headaches. Negative for dizziness and numbness.  Psychiatric/Behavioral: Negative.     Per HPI unless specifically indicated above     Objective:    BP 102/69    Pulse 83    Temp 98.2 F (36.8 C) (Oral)    Ht 5' 3.5" (1.613 m)    Wt 98 lb 6.4 oz (44.6 kg)    SpO2 98%    BMI 17.16 kg/m   Wt Readings from Last 3 Encounters:  02/21/21 98 lb 6.4 oz (44.6 kg)  01/11/21 99 lb 6.4 oz (45.1 kg)  12/14/20 98 lb 6 oz (44.6 kg)    Physical Exam Vitals and nursing note reviewed.  Constitutional:      General: She is awake. She is not in acute distress.    Appearance: She is well-developed and well-groomed. She is not ill-appearing or toxic-appearing.  HENT:     Head: Normocephalic.     Right Ear: Hearing, tympanic membrane, ear canal and external ear normal.     Left Ear: Hearing,  tympanic membrane, ear canal and external ear normal.     Nose: Rhinorrhea present. Rhinorrhea is clear.     Right Sinus: No maxillary sinus tenderness or frontal sinus tenderness.     Left Sinus: No maxillary sinus tenderness or frontal sinus tenderness.     Mouth/Throat:     Mouth: Mucous membranes are moist.     Pharynx: Posterior oropharyngeal erythema (with cobblestone appearance.) present. No pharyngeal swelling or oropharyngeal exudate.  Eyes:     General: Lids are normal.        Right eye: No discharge.        Left eye: No discharge.     Conjunctiva/sclera: Conjunctivae normal.      Pupils: Pupils are equal, round, and reactive to light.  Neck:     Thyroid: No thyromegaly.     Vascular: No carotid bruit.  Cardiovascular:     Rate and Rhythm: Normal rate and regular rhythm.     Heart sounds: Normal heart sounds. No murmur heard.   No gallop.  Pulmonary:     Effort: Pulmonary effort is normal. No accessory muscle usage or respiratory distress.     Breath sounds: Normal breath sounds.  Abdominal:     General: Bowel sounds are normal.     Palpations: Abdomen is soft. There is no hepatomegaly or splenomegaly.  Musculoskeletal:     Cervical back: Normal range of motion and neck supple.     Right lower leg: No edema.     Left lower leg: No edema.  Lymphadenopathy:     Head:     Right side of head: No submental, submandibular, tonsillar, preauricular or posterior auricular adenopathy.     Left side of head: No submental, submandibular, tonsillar, preauricular or posterior auricular adenopathy.     Cervical: No cervical adenopathy.  Skin:    General: Skin is warm and dry.  Neurological:     Mental Status: She is alert and oriented to person, place, and time.  Psychiatric:        Attention and Perception: Attention normal.        Mood and Affect: Mood normal.        Speech: Speech normal.        Behavior: Behavior normal. Behavior is cooperative.        Thought Content: Thought content normal.    Results for orders placed or performed in visit on 01/11/21  Novel Coronavirus, NAA ([facility])   Specimen: Nasopharyngeal(NP) swabs in vial transport medium  Result Value Ref Range   SARS-CoV-2, NAA Not Detected Not Detected  Veritor Flu A/B Waived  Result Value Ref Range   Influenza A Negative Negative   Influenza B Negative Negative  Urinalysis, Routine w reflex microscopic  Result Value Ref Range   Specific Gravity, UA >1.030 (H) 1.005 - 1.030   pH, UA 5.5 5.0 - 7.5   Color, UA Yellow Yellow   Appearance Ur Clear Clear   Leukocytes,UA Negative Negative    Protein,UA Negative Negative/Trace   Glucose, UA Negative Negative   Ketones, UA Negative Negative   RBC, UA Negative Negative   Bilirubin, UA Negative Negative   Urobilinogen, Ur 0.2 0.2 - 1.0 mg/dL   Nitrite, UA Negative Negative      Assessment & Plan:   Problem List Items Addressed This Visit       Respiratory   Sore throat (viral) - Primary    Ongoing symptoms since after Christmas, going on 2 weeks, however these are  improving. Family had been around someone with mono, but no direct exposure.  At this time she wishes to hold off on testing for this.  Will hold off on Covid and flu testing too, as recently got over the flu at beginning of December.  Suspect more viral upper respiratory and she reports is improving.  Will send in Prednisone 40 MG daily x 5 days and Viscous Lidocaine.  Recommend: - Increased rest - Increasing Fluids - Acetaminophen / ibuprofen as needed for fever/pain.  - Salt water gargling, chloraseptic spray and throat lozenges - Mucinex.  - Saline sinus flushes or a neti pot.  Return for worsening or ongoing symptoms.        Follow up plan: Return if symptoms worsen or fail to improve.

## 2021-03-14 ENCOUNTER — Ambulatory Visit (INDEPENDENT_AMBULATORY_CARE_PROVIDER_SITE_OTHER): Payer: Self-pay | Admitting: Nurse Practitioner

## 2021-03-14 ENCOUNTER — Ambulatory Visit
Admission: RE | Admit: 2021-03-14 | Discharge: 2021-03-14 | Disposition: A | Discharge status: Home / Self Care | Payer: PRIVATE HEALTH INSURANCE | Source: Ambulatory Visit | Attending: Nurse Practitioner | Admitting: Nurse Practitioner

## 2021-03-14 ENCOUNTER — Other Ambulatory Visit: Payer: Self-pay

## 2021-03-14 ENCOUNTER — Ambulatory Visit
Admission: RE | Admit: 2021-03-14 | Discharge: 2021-03-14 | Disposition: A | Discharge status: Home / Self Care | Payer: PRIVATE HEALTH INSURANCE | Attending: Nurse Practitioner | Admitting: Nurse Practitioner

## 2021-03-14 ENCOUNTER — Encounter: Payer: Self-pay | Admitting: Nurse Practitioner

## 2021-03-14 VITALS — BP 111/75 | HR 89 | Temp 98.5°F | Wt 100.0 lb

## 2021-03-14 DIAGNOSIS — M25532 Pain in left wrist: Secondary | ICD-10-CM | POA: Diagnosis present

## 2021-03-14 NOTE — Progress Notes (Signed)
BP 111/75    Pulse 89    Temp 98.5 F (36.9 C) (Oral)    Wt 100 lb (45.4 kg)    SpO2 98%    BMI 17.44 kg/m    Subjective:    Patient ID: Ann Wilkerson, female    DOB: 1980/10/17, 41 y.o.   MRN: [id number]  HPI: Ann Wilkerson is a 41 y.o. female  Chief Complaint  Patient presents with   Wrist Pain    Pt states she has been having L wrist pain since Thursday. 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 Rating pain at a 6.    WRIST PAIN  Duration: days Involved wrist: left Mechanism of injury:   unloading a truck on Thursday and she heard a pop Location: medial Onset: sudden Severity: 6/10  Quality:  sharp and shooting Frequency: intermittent Radiation: yes Aggravating factors: movement, sleeping, and gripping  Alleviating factors:  bengay, aleve roll on   Status: stable Treatments attempted: aleve    Relief with NSAIDs?:  moderate Weakness: yes Numbness: yes Median nerve distribution Redness: no Bruising: yes Swelling: yes Fevers: no  Relevant past medical, surgical, family and social history reviewed and updated as indicated. Interim medical history since our last visit reviewed. Allergies and medications reviewed and updated.  Review of Systems  Musculoskeletal:        Left wrist pain   Per HPI unless specifically indicated above     Objective:    BP 111/75    Pulse 89    Temp 98.5 F (36.9 C) (Oral)    Wt 100 lb (45.4 kg)    SpO2 98%    BMI 17.44 kg/m   Wt Readings from Last 3 Encounters:  03/14/21 100 lb (45.4 kg)  02/21/21 98 lb 6.4 oz (44.6 kg)  01/11/21 99 lb 6.4 oz (45.1 kg)    Physical Exam Vitals and nursing note reviewed.  Constitutional:      General: She is not in acute distress.    Appearance: Normal appearance. She is normal weight. She is not ill-appearing, toxic-appearing or diaphoretic.  HENT:     Head: Normocephalic.     Right Ear: External ear normal.     Left Ear: External ear normal.     Nose: Nose  normal.     Mouth/Throat:     Mouth: Mucous membranes are moist.     Pharynx: Oropharynx is clear.  Eyes:     General:        Right eye: No discharge.        Left eye: No discharge.     Extraocular Movements: Extraocular movements intact.     Conjunctiva/sclera: Conjunctivae normal.     Pupils: Pupils are equal, round, and reactive to light.  Cardiovascular:     Rate and Rhythm: Normal rate and regular rhythm.     Heart sounds: No murmur heard. Pulmonary:     Effort: Pulmonary effort is normal. No respiratory distress.     Breath sounds: Normal breath sounds. No wheezing or rales.  Musculoskeletal:     Right wrist: Normal.     Left wrist: Tenderness and bony tenderness present. No swelling.     Cervical back: Normal range of motion and neck supple.  Skin:    General: Skin is warm and dry.     Capillary Refill: Capillary refill takes less than 2 seconds.  Neurological:     General: No focal deficit present.  Mental Status: She is alert and oriented to person, place, and time. Mental status is at baseline.  Psychiatric:        Mood and Affect: Mood normal.        Behavior: Behavior normal.        Thought Content: Thought content normal.        Judgment: Judgment normal.    Results for orders placed or performed in visit on 01/11/21  Novel Coronavirus, NAA ([facility])   Specimen: Nasopharyngeal(NP) swabs in vial transport medium  Result Value Ref Range   SARS-CoV-2, NAA Not Detected Not Detected  Veritor Flu A/B Waived  Result Value Ref Range   Influenza A Negative Negative   Influenza B Negative Negative  Urinalysis, Routine w reflex microscopic  Result Value Ref Range   Specific Gravity, UA >1.030 (H) 1.005 - 1.030   pH, UA 5.5 5.0 - 7.5   Color, UA Yellow Yellow   Appearance Ur Clear Clear   Leukocytes,UA Negative Negative   Protein,UA Negative Negative/Trace   Glucose, UA Negative Negative   Ketones, UA Negative Negative   RBC, UA Negative Negative   Bilirubin,  UA Negative Negative   Urobilinogen, Ur 0.2 0.2 - 1.0 mg/dL   Nitrite, UA Negative Negative      Assessment & Plan:   Problem List Items Addressed This Visit   None Visit Diagnoses     Left wrist pain    -  Primary   Xray ordered during visit. Letter written for work.  Will make recommendations based on imaging results.    Relevant Orders   DG Wrist Complete Left        Follow up plan: Return if symptoms worsen or fail to improve.

## 2021-03-15 NOTE — Progress Notes (Signed)
Please let patient know that her xray was negative. I recommend she see Orthopedics for further evaluation and management. I have placed the referral.

## 2021-03-15 NOTE — Addendum Note (Signed)
Addended by: Jon Billings on: 03/15/2021 10:23 AM   Modules accepted: Orders

## 2021-03-30 ENCOUNTER — Encounter: Payer: PRIVATE HEALTH INSURANCE | Admitting: Nurse Practitioner

## 2021-04-08 NOTE — Patient Instructions (Incomplete)
Managing Anxiety, Adult ?After being diagnosed with anxiety, you may be relieved to know why you have felt or behaved a certain way. You may also feel overwhelmed about the treatment ahead and what it will mean for your life. With care and support, you can manage this condition. ?How to manage lifestyle changes ?Managing stress and anxiety ?Stress is your body's reaction to life changes and events, both good and bad. Most stress will last just a few hours, but stress can be ongoing and can lead to more than just stress. Although stress can play a major role in anxiety, it is not the same as anxiety. Stress is usually caused by something external, such as a deadline, test, or competition. Stress normally passes after the triggering event has ended.  ?Anxiety is caused by something internal, such as imagining a terrible outcome or worrying that something will go wrong that will devastate you. Anxiety often does not go away even after the triggering event is over, and it can become long-term (chronic) worry. It is important to understand the differences between stress and anxiety and to manage your stress effectively so that it does not lead to an anxious response. ?Talk with your health care provider or a counselor to learn more about reducing anxiety and stress. He or she may suggest tension reduction techniques, such as: ?Music therapy. Spend time creating or listening to music that you enjoy and that inspires you. ?Mindfulness-based meditation. Practice being aware of your normal breaths while not trying to control your breathing. It can be done while sitting or walking. ?Centering prayer. This involves focusing on a word, phrase, or sacred image that means something to you and brings you peace. ?Deep breathing. To do this, expand your stomach and inhale slowly through your nose. Hold your breath for 3-5 seconds. Then exhale slowly, letting your stomach muscles relax. ?Self-talk. Learn to notice and identify  thought patterns that lead to anxiety reactions and change those patterns to thoughts that feel peaceful. ?Muscle relaxation. Taking time to tense muscles and then relax them. ?Choose a tension reduction technique that fits your lifestyle and personality. These techniques take time and practice. Set aside 5-15 minutes a day to do them. Therapists can offer counseling and training in these techniques. The training to help with anxiety may be covered by some insurance plans. ?Other things you can do to manage stress and anxiety include: ?Keeping a stress diary. This can help you learn what triggers your reaction and then learn ways to manage your response. ?Thinking about how you react to certain situations. You may not be able to control everything, but you can control your response. ?Making time for activities that help you relax and not feeling guilty about spending your time in this way. ?Doing visual imagery. This involves imagining or creating mental pictures to help you relax. ?Practicing yoga. Through yoga poses, you can lower tension and promote relaxation. ? ?Medicines ?Medicines can help ease symptoms. Medicines for anxiety include: ?Antidepressant medicines. These are usually prescribed for long-term daily control. ?Anti-anxiety medicines. These may be added in severe cases, especially when panic attacks occur. ?Medicines will be prescribed by a health care provider. When used together, medicines, psychotherapy, and tension reduction techniques may be the most effective treatment. ?Relationships ?Relationships can play a big part in helping you recover. Try to spend more time connecting with trusted friends and family members. ?Consider going to couples counseling if you have a partner, taking family education classes, or going to family   therapy. ?Therapy can help you and others better understand your condition. ?How to recognize changes in your anxiety ?Everyone responds differently to treatment for  anxiety. Recovery from anxiety happens when symptoms decrease and stop interfering with your daily activities at home or work. This may mean that you will start to: ?Have better concentration and focus. Worry will interfere less in your daily thinking. ?Sleep better. ?Be less irritable. ?Have more energy. ?Have improved memory. ?It is also important to recognize when your condition is getting worse. Contact your health care provider if your symptoms interfere with home or work and you feel like your condition is not improving. ?Follow these instructions at home: ?Activity ?Exercise. Adults should do the following: ?Exercise for at least 150 minutes each week. The exercise should increase your heart rate and make you sweat (moderate-intensity exercise). ?Strengthening exercises at least twice a week. ?Get the right amount and quality of sleep. Most adults need 7-9 hours of sleep each night. ?Lifestyle ? ?Eat a healthy diet that includes plenty of vegetables, fruits, whole grains, low-fat dairy products, and lean protein. ?Do not eat a lot of foods that are high in fats, added sugars, or salt (sodium). ?Make choices that simplify your life. ?Do not use any products that contain nicotine or tobacco. These products include cigarettes, chewing tobacco, and vaping devices, such as e-cigarettes. If you need help quitting, ask your health care provider. ?Avoid caffeine, alcohol, and certain over-the-counter cold medicines. These may make you feel worse. Ask your pharmacist which medicines to avoid. ?General instructions ?Take over-the-counter and prescription medicines only as told by your health care provider. ?Keep all follow-up visits. This is important. ?Where to find support ?You can get help and support from these sources: ?Self-help groups. ?Online and community organizations. ?A trusted spiritual leader. ?Couples counseling. ?Family education classes. ?Family therapy. ?Where to find more information ?You may find  that joining a support group helps you deal with your anxiety. The following sources can help you locate counselors or support groups near you: ?Mental Health [place]: www.mentalhealthamerica.net ?Anxiety and Depression Association of America (ADAA): www.adaa.org ?National Alliance on Mental Illness (NAMI): www.nami.org ?Contact a health care provider if: ?You have a hard time staying focused or finishing daily tasks. ?You spend many hours a day feeling worried about everyday life. ?You become exhausted by worry. ?You start to have headaches or frequently feel tense. ?You develop chronic nausea or diarrhea. ?Get help right away if: ?You have a racing heart and shortness of breath. ?You have thoughts of hurting yourself or others. ?If you ever feel like you may hurt yourself or others, or have thoughts about taking your own life, get help right away. Go to your nearest emergency department or: ?Call your local emergency services (911 in the [place] ?Call a suicide crisis helpline, such as the National Suicide Prevention Lifeline at 1-800-273-8255 or 988 in the [place] This is open 24 hours a day in the [place] ?Text the Crisis Text Line at 741741 (in the [place] ?Summary ?Taking steps to learn and use tension reduction techniques can help calm you and help prevent triggering an anxiety reaction. ?When used together, medicines, psychotherapy, and tension reduction techniques may be the most effective treatment. ?Family, friends, and partners can play a big part in supporting you. ?This information is not intended to replace advice given to you by your health care provider. Make sure you discuss any questions you have with your health care provider. ?Document Revised: 08/23/2020 Document Reviewed: 05/21/2020 ?Elsevier Patient   Education ? 2022 Elsevier Inc. ? ?

## 2021-04-12 ENCOUNTER — Encounter: Payer: Self-pay | Admitting: Nurse Practitioner

## 2021-04-12 DIAGNOSIS — A5903 Trichomonal cystitis and urethritis: Secondary | ICD-10-CM

## 2021-04-12 DIAGNOSIS — E7800 Pure hypercholesterolemia, unspecified: Secondary | ICD-10-CM

## 2021-04-12 DIAGNOSIS — E559 Vitamin D deficiency, unspecified: Secondary | ICD-10-CM

## 2021-04-12 DIAGNOSIS — Z0000 Encounter for general adult medical examination without abnormal findings: Secondary | ICD-10-CM

## 2021-04-12 DIAGNOSIS — F319 Bipolar disorder, unspecified: Secondary | ICD-10-CM

## 2021-04-12 DIAGNOSIS — F17210 Nicotine dependence, cigarettes, uncomplicated: Secondary | ICD-10-CM

## 2021-04-12 DIAGNOSIS — J301 Allergic rhinitis due to pollen: Secondary | ICD-10-CM

## 2021-04-29 NOTE — Patient Instructions (Incomplete)
Healthy Eating ?Following a healthy eating pattern may help you to achieve and maintain a healthy body weight, reduce the risk of chronic disease, and live a long and productive life. It is important to follow a healthy eating pattern at an appropriate calorie level for your body. Your nutritional needs should be met primarily through food by choosing a variety of nutrient-rich foods. ?What are tips for following this plan? ?Reading food labels ?Read labels and choose the following: ?Reduced or low sodium. ?Juices with 100% fruit juice. ?Foods with low saturated fats and high polyunsaturated and monounsaturated fats. ?Foods with whole grains, such as whole wheat, cracked wheat, brown rice, and wild rice. ?Whole grains that are fortified with folic acid. This is recommended for women who are pregnant or who want to become pregnant. ?Read labels and avoid the following: ?Foods with a lot of added sugars. These include foods that contain brown sugar, corn sweetener, corn syrup, dextrose, fructose, glucose, high-fructose corn syrup, honey, invert sugar, lactose, malt syrup, maltose, molasses, raw sugar, sucrose, trehalose, or turbinado sugar. ?Do not eat more than the following amounts of added sugar per day: ?6 teaspoons (25 g) for women. ?9 teaspoons (38 g) for men. ?Foods that contain processed or refined starches and grains. ?Refined grain products, such as white flour, degermed cornmeal, white bread, and white rice. ?Shopping ?Choose nutrient-rich snacks, such as vegetables, whole fruits, and nuts. Avoid high-calorie and high-sugar snacks, such as potato chips, fruit snacks, and candy. ?Use oil-based dressings and spreads on foods instead of solid fats such as butter, stick margarine, or cream cheese. ?Limit pre-made sauces, mixes, and "instant" products such as flavored rice, instant noodles, and ready-made pasta. ?Try more plant-protein sources, such as tofu, tempeh, black beans, edamame, lentils, nuts, and  seeds. ?Explore eating plans such as the Mediterranean diet or vegetarian diet. ?Cooking ?Use oil to saut? or stir-fry foods instead of solid fats such as butter, stick margarine, or lard. ?Try baking, boiling, grilling, or broiling instead of frying. ?Remove the fatty part of meats before cooking. ?Steam vegetables in water or broth. ?Meal planning ? ?At meals, imagine dividing your plate into fourths: ?One-half of your plate is fruits and vegetables. ?One-fourth of your plate is whole grains. ?One-fourth of your plate is protein, especially lean meats, poultry, eggs, tofu, beans, or nuts. ?Include low-fat dairy as part of your daily diet. ?Lifestyle ?Choose healthy options in all settings, including home, work, school, restaurants, or stores. ?Prepare your food safely: ?Wash your hands after handling raw meats. ?Keep food preparation surfaces clean by regularly washing with hot, soapy water. ?Keep raw meats separate from ready-to-eat foods, such as fruits and vegetables. ?Cook seafood, meat, poultry, and eggs to the recommended internal temperature. ?Store foods at safe temperatures. In general: ?Keep cold foods at 40?F (4.4?C) or below. ?Keep hot foods at 140?F (60?C) or above. ?Keep your freezer at 0?F (-17.8?C) or below. ?Foods are no longer safe to eat when they have been between the temperatures of 40?-140?F (4.4-60?C) for more than 2 hours. ?What foods should I eat? ?Fruits ?Aim to eat 2 cup-equivalents of fresh, canned (in natural juice), or frozen fruits each day. Examples of 1 cup-equivalent of fruit include 1 small apple, 8 large strawberries, 1 cup canned fruit, ? cup dried fruit, or 1 cup 100% juice. ?Vegetables ?Aim to eat 2?-3 cup-equivalents of fresh and frozen vegetables each day, including different varieties and colors. Examples of 1 cup-equivalent of vegetables include 2 medium carrots, 2 cups raw,  leafy greens, 1 cup chopped vegetable (raw or cooked), or 1 medium baked potato. ?Grains ?Aim to  eat 6 ounce-equivalents of whole grains each day. Examples of 1 ounce-equivalent of grains include 1 slice of bread, 1 cup ready-to-eat cereal, 3 cups popcorn, or ? cup cooked rice, pasta, or cereal. ?Meats and other proteins ?Aim to eat 5-6 ounce-equivalents of protein each day. Examples of 1 ounce-equivalent of protein include 1 egg, 1/2 cup nuts or seeds, or 1 tablespoon (16 g) peanut butter. A cut of meat or fish that is the size of a deck of cards is about 3-4 ounce-equivalents. ?Of the protein you eat each week, try to have at least 8 ounces come from seafood. This includes salmon, trout, herring, and anchovies. ?Dairy ?Aim to eat 3 cup-equivalents of fat-free or low-fat dairy each day. Examples of 1 cup-equivalent of dairy include 1 cup (240 mL) milk, 8 ounces (250 g) yogurt, 1? ounces (44 g) natural cheese, or 1 cup (240 mL) fortified soy milk. ?Fats and oils ?Aim for about 5 teaspoons (21 g) per day. Choose monounsaturated fats, such as canola and olive oils, avocados, peanut butter, and most nuts, or polyunsaturated fats, such as sunflower, corn, and soybean oils, walnuts, pine nuts, sesame seeds, sunflower seeds, and flaxseed. ?Beverages ?Aim for six 8-oz glasses of water per day. Limit coffee to three to five 8-oz cups per day. ?Limit caffeinated beverages that have added calories, such as soda and energy drinks. ?Limit alcohol intake to no more than 1 drink a day for nonpregnant women and 2 drinks a day for men. One drink equals 12 oz of beer (355 mL), 5 oz of wine (148 mL), or 1? oz of hard liquor (44 mL). ?Seasoning and other foods ?Avoid adding excess amounts of salt to your foods. Try flavoring foods with herbs and spices instead of salt. ?Avoid adding sugar to foods. ?Try using oil-based dressings, sauces, and spreads instead of solid fats. ?This information is based on general [place] nutrition guidelines. For more information, visit [employer].es. Exact amounts may vary based on your nutrition  needs. ?Summary ?A healthy eating plan may help you to maintain a healthy weight, reduce the risk of chronic diseases, and stay active throughout your life. ?Plan your meals. Make sure you eat the right portions of a variety of nutrient-rich foods. ?Try baking, boiling, grilling, or broiling instead of frying. ?Choose healthy options in all settings, including home, work, school, restaurants, or stores. ?This information is not intended to replace advice given to you by your health care provider. Make sure you discuss any questions you have with your health care provider. ?Document Revised: 09/26/2020 Document Reviewed: 09/26/2020 ?Elsevier Patient Education ? [place]. ? ?

## 2021-05-04 ENCOUNTER — Ambulatory Visit: Payer: Self-pay | Admitting: Nurse Practitioner

## 2021-05-04 DIAGNOSIS — Z0000 Encounter for general adult medical examination without abnormal findings: Secondary | ICD-10-CM

## 2021-05-04 DIAGNOSIS — E7800 Pure hypercholesterolemia, unspecified: Secondary | ICD-10-CM

## 2021-05-04 DIAGNOSIS — F319 Bipolar disorder, unspecified: Secondary | ICD-10-CM

## 2021-05-04 DIAGNOSIS — Z8619 Personal history of other infectious and parasitic diseases: Secondary | ICD-10-CM

## 2021-05-04 DIAGNOSIS — F17210 Nicotine dependence, cigarettes, uncomplicated: Secondary | ICD-10-CM

## 2021-05-04 DIAGNOSIS — J301 Allergic rhinitis due to pollen: Secondary | ICD-10-CM

## 2021-05-04 DIAGNOSIS — E559 Vitamin D deficiency, unspecified: Secondary | ICD-10-CM

## 2021-05-12 NOTE — Patient Instructions (Incomplete)
Bipolar I Disorder ?Bipolar I disorder is a mental health disorder in which a person has episodes of emotional highs (mania) and may also have episodes of lows (depression). Bipolar I disorder differs from other bipolar disorders in that it involves extreme episodes of mania (manic episodes). These episodes last at least one week or involve severe symptoms that require a hospital stay to keep the person safe. ?What are the causes? ?The cause of this condition is not known. ?What increases the risk? ?The following factors may make you more likely to develop this condition: ?Having a family member with the disorder. ?Having an imbalance of certain chemicals in the brain (neurotransmitters). ?Experiencing stress, such as from illness, financial problems, or a death. ?Having certain conditions that affect the brain or spinal cord (neurologic conditions). ?Having had a brain injury (trauma). ?What are the signs or symptoms? ?Symptoms of bipolar I disorder include the following: ?Symptoms of mania ?Very high self-esteem or self-confidence. ?Less need for sleep. ?Unusual talkativeness. Speech may be very fast. ?Racing thoughts with quick shifts between topics that may or may not be related (flight of ideas). ?Being much more able or much less able to concentrate. ?Increased purposeful activity, such as work, studies, or social activity. ?Increased agitation. This could be pacing, squirming, fidgeting, or finger and toe tapping. ?Impulsive behavior and poor judgment. These may lead to high-risk activities that are sexual, financial, or physical. ?Symptoms of depression ?Extreme sadness, uncontrollable crying, or feeling hopeless, worthless, or numb. ?Sleep problems, such as trouble falling asleep or staying asleep (insomnia), waking early, or sleeping too much. ?No longer enjoying things you used to enjoy. ?Isolation, or spending time alone often. ?Lack of energy or motivation, and moving more slowly than normal. ?Trouble  making decisions. ?Increased appetite or loss of appetite. ?Thoughts of death, or wanting to harm yourself. ?Sometimes, you may have a mixed mood. This means having symptoms of mania and depression at the same time. Stress can often trigger these symptoms. ?How is this diagnosed? ?This condition may be diagnosed based on a mental health evaluation that includes a review of: ?Emotional episodes. ?Medical history. ?Use of alcohol, drugs, and prescription medicines. Certain medical conditions and substances can cause secondary bipolar disorder. This has symptoms like the symptoms of bipolar disorder. ?Your health care provider may ask you to take a short test to help understand your symptoms. You may also be asked to follow up with a mental health provider for further evaluation or to start treatment. ?How is this treated? ?  ?This condition is a long-term (chronic) illness. It is often managed with ongoing treatment rather than treatment only when symptoms occur. A combination of treatments is often used. Treatment may include: ?Medicines, usually medicines called mood stabilizers. Medicines can be prescribed by a health care provider who specializes in treating mental health disorders (psychiatrist). If symptoms occur during use of a mood stabilizer, other medicines may be added. ?Talk therapy (psychotherapy) to help you manage bipolar disorder. Talk therapy includes cognitive behavioral therapy (CBT) and family therapy. ?Psychoeducation. This helps you and others understand how your disorder is managed. Include friends and family in educational sessions so they learn how best to support you. ?Methods of managing your condition, such as journaling or relaxation exercises. Exercises include: ?Yoga. ?Meditation. ?Deep breathing. ?Lifestyle changes, such as: ?Limiting alcohol and drug use. ?Exercising regularly. ?Setting a regular bedtime and wake time. ?Eating a healthy diet. ?Electroconvulsive therapy (ECT). This is a  procedure in which electricity is applied to   the brain through the scalp. ECT may be used for severe bipolar disorder when medicine and psychotherapy work too slowly or do not work. ?Follow these instructions at home: ?Activity ?Return to your normal activities as told by your health care provider. ?Find activities that you enjoy, and make time to do them. ?Get regular exercise. ?Lifestyle ? ?Follow a set schedule for eating and sleeping. ?Eat a healthy diet that includes fresh fruits and vegetables, whole grains, low-fat dairy, and lean meat. ?Get at least 7-8 hours of sleep each night. ?Avoid using products that contain nicotine or tobacco. If you want help quitting, ask your health care provider. ?Avoid alcohol and drugs. They can affect how medicine works and make symptoms worse. ?General instructions ?Take over-the-counter and prescription medicines only as told by your health care provider. You may think about stopping your medicine, but you need to take all your medicine as prescribed. This helps manage your symptoms. ?Consider joining a support group. Your health care provider may be able to recommend one. ?Talk with your family and friends about your treatment goals and how loved ones can help. ?Keep all follow-up visits. This is important. ?Where to find more information ?National Alliance on Mental Illness: nami.org ?National Institute of Mental Health: nimh.nih.gov ?Contact a health care provider if: ?Your symptoms get worse, or your loved ones tell you that your symptoms are getting worse. ?You have uncomfortable side effects from your medicine. ?You have trouble sleeping. ?You have trouble doing daily activities. ?You feel unsafe in your surroundings. ?You are using alcohol or drugs to manage your symptoms. ?Get help right away if: ?You have new symptoms. ?You have thoughts about harming yourself or others. ?You are considering suicide. ?If you ever feel like you may hurt yourself or others, or have  thoughts about taking your own life, get help right away. Go to your nearest emergency department or: ?Call your local emergency services (911 in the [place] ?Call a suicide crisis helpline, such as the National Suicide Prevention Lifeline at 1-800-273-8255 or 988 in the [place] This is open 24 hours a day in the [place] ?Text the Crisis Text Line at 741741 (in the [place] ?Summary ?Bipolar I disorder is a lifelong mental health disorder in which a person has episodes of mania and depression. ?Treatment for this disorder involves a combination of treatments such as medicines and talk and behavioral therapies. ?Include friends and family in educational sessions so they know how best to support you. ?Get help right away if you are considering suicide. ?This information is not intended to replace advice given to you by your health care provider. Make sure you discuss any questions you have with your health care provider. ?Document Revised: 08/24/2020 Document Reviewed: 07/20/2020 ?Elsevier Patient Education ? 2022 Elsevier Inc. ? ?

## 2021-05-16 ENCOUNTER — Ambulatory Visit: Payer: Self-pay | Admitting: Nurse Practitioner

## 2021-05-16 DIAGNOSIS — J301 Allergic rhinitis due to pollen: Secondary | ICD-10-CM

## 2021-05-16 DIAGNOSIS — E559 Vitamin D deficiency, unspecified: Secondary | ICD-10-CM

## 2021-05-16 DIAGNOSIS — Z0000 Encounter for general adult medical examination without abnormal findings: Secondary | ICD-10-CM

## 2021-05-16 DIAGNOSIS — F319 Bipolar disorder, unspecified: Secondary | ICD-10-CM

## 2021-05-16 DIAGNOSIS — Z8619 Personal history of other infectious and parasitic diseases: Secondary | ICD-10-CM

## 2021-05-16 DIAGNOSIS — E7800 Pure hypercholesterolemia, unspecified: Secondary | ICD-10-CM

## 2021-06-02 NOTE — Patient Instructions (Incomplete)

## 2021-06-04 ENCOUNTER — Ambulatory Visit: Payer: Self-pay | Admitting: Nurse Practitioner

## 2021-06-04 DIAGNOSIS — F17210 Nicotine dependence, cigarettes, uncomplicated: Secondary | ICD-10-CM

## 2021-06-04 DIAGNOSIS — E559 Vitamin D deficiency, unspecified: Secondary | ICD-10-CM

## 2021-06-04 DIAGNOSIS — J301 Allergic rhinitis due to pollen: Secondary | ICD-10-CM

## 2021-06-04 DIAGNOSIS — E7800 Pure hypercholesterolemia, unspecified: Secondary | ICD-10-CM

## 2021-06-04 DIAGNOSIS — F319 Bipolar disorder, unspecified: Secondary | ICD-10-CM

## 2021-06-04 DIAGNOSIS — Z0000 Encounter for general adult medical examination without abnormal findings: Secondary | ICD-10-CM

## 2021-06-04 DIAGNOSIS — Z79899 Other long term (current) drug therapy: Secondary | ICD-10-CM

## 2021-06-15 ENCOUNTER — Telehealth: Payer: Self-pay

## 2021-06-20 NOTE — Telephone Encounter (Signed)
error 

## 2021-06-28 ENCOUNTER — Encounter: Payer: Self-pay | Admitting: Nurse Practitioner

## 2022-04-09 ENCOUNTER — Telehealth: Payer: Self-pay

## 2022-04-09 NOTE — Transitions of Care (Post Inpatient/ED Visit) (Signed)
   04/09/2022  Name: Ann Wilkerson MRN: [id number] DOB: 28-Sep-1980  Today's TOC FU Call Status: Today's TOC FU Call Status:: Unsuccessul Call (1st Attempt) Unsuccessful Call (1st Attempt) Date: 04/09/22  Attempted to reach the patient regarding the most recent Inpatient/ED visit.  Follow Up Plan: Additional outreach attempts will be made to reach the patient to complete the Transitions of Care (Post Inpatient/ED visit) call.   [place], [place]

## 2022-04-10 NOTE — Transitions of Care (Post Inpatient/ED Visit) (Signed)
   04/10/2022  Name: Ann Wilkerson MRN: [id number] DOB: 1980-08-13  Today's TOC FU Call Status: Today's TOC FU Call Status:: Unsuccessful Call (2nd Attempt) Unsuccessful Call (1st Attempt) Date: 04/09/22  Attempted to reach the patient regarding the most recent Inpatient/ED visit.  Follow Up Plan: Additional outreach attempts will be made to reach the patient to complete the Transitions of Care (Post Inpatient/ED visit) call.   [place], [place]

## 2022-04-12 NOTE — Transitions of Care (Post Inpatient/ED Visit) (Signed)
   04/12/2022  Name: Ann Wilkerson MRN: [id number] DOB: 1980-11-14  Today's TOC FU Call Status: Today's TOC FU Call Status:: Unsuccessful Call (3rd Attempt) Unsuccessful Call (1st Attempt) Date: 04/09/22  Attempted to reach the patient regarding the most recent Inpatient/ED visit.  Follow Up Plan: No further outreach attempts will be made at this time. We have been unable to contact the patient.  [place], [place]

## 2022-10-19 IMAGING — DX DG WRIST COMPLETE 3+V*L*
4 series · 4 of 4 positions shown · non-contrast
Comparison: None.

CLINICAL DATA: Left wrist pain.

EXAM:
LEFT WRIST - COMPLETE 3+ VIEW

[wrist ap]
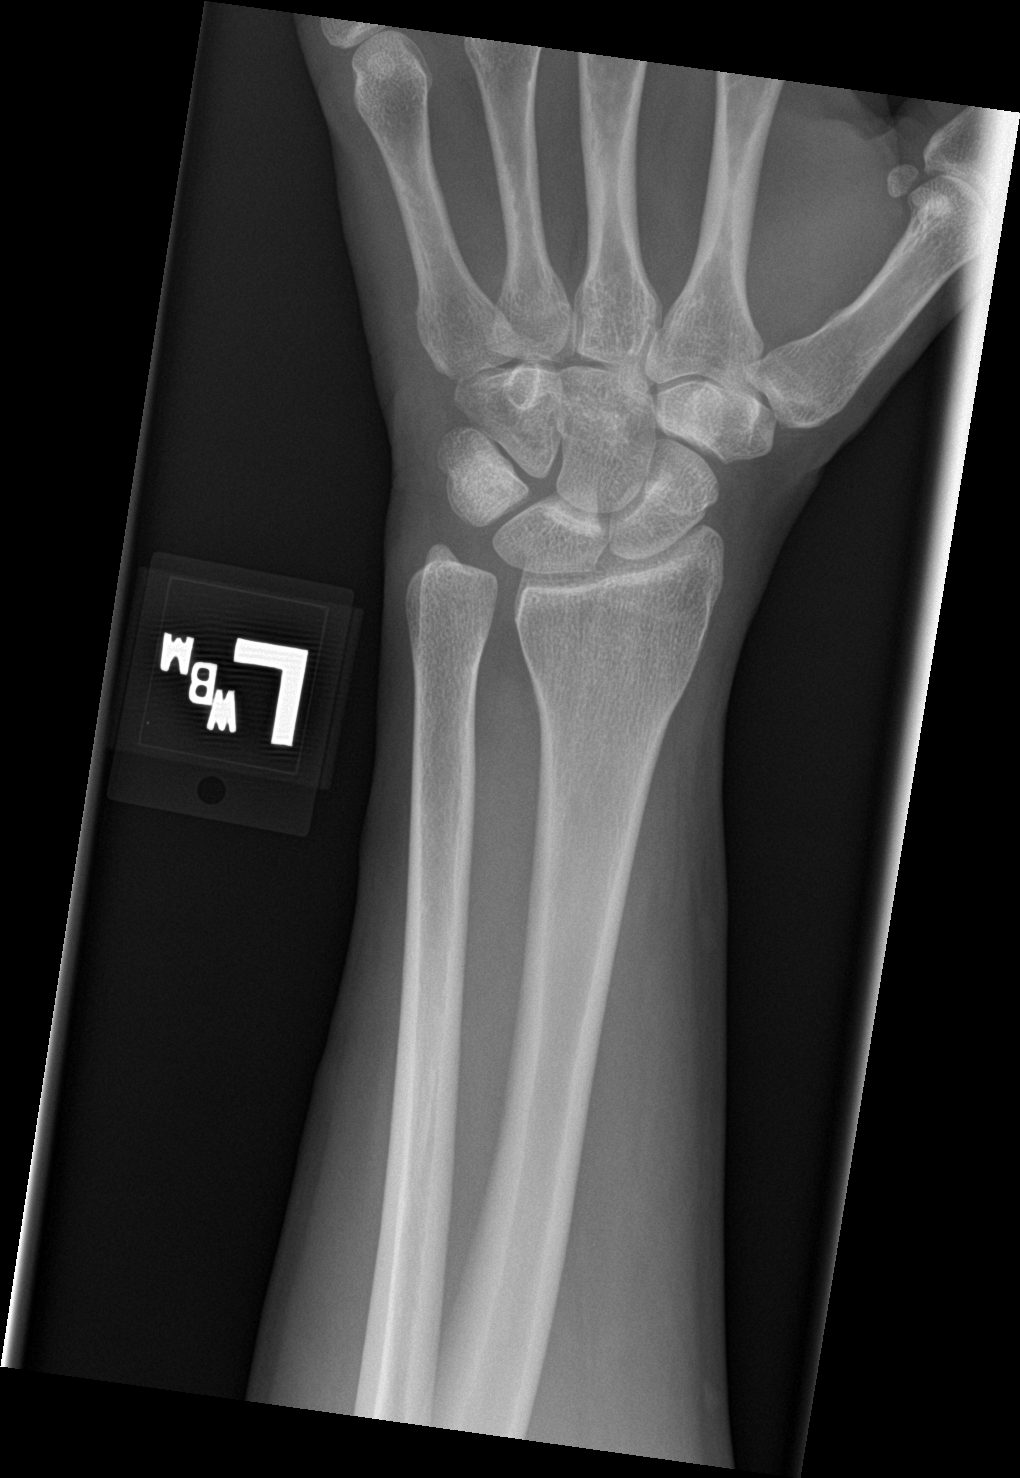

[wrist obl]
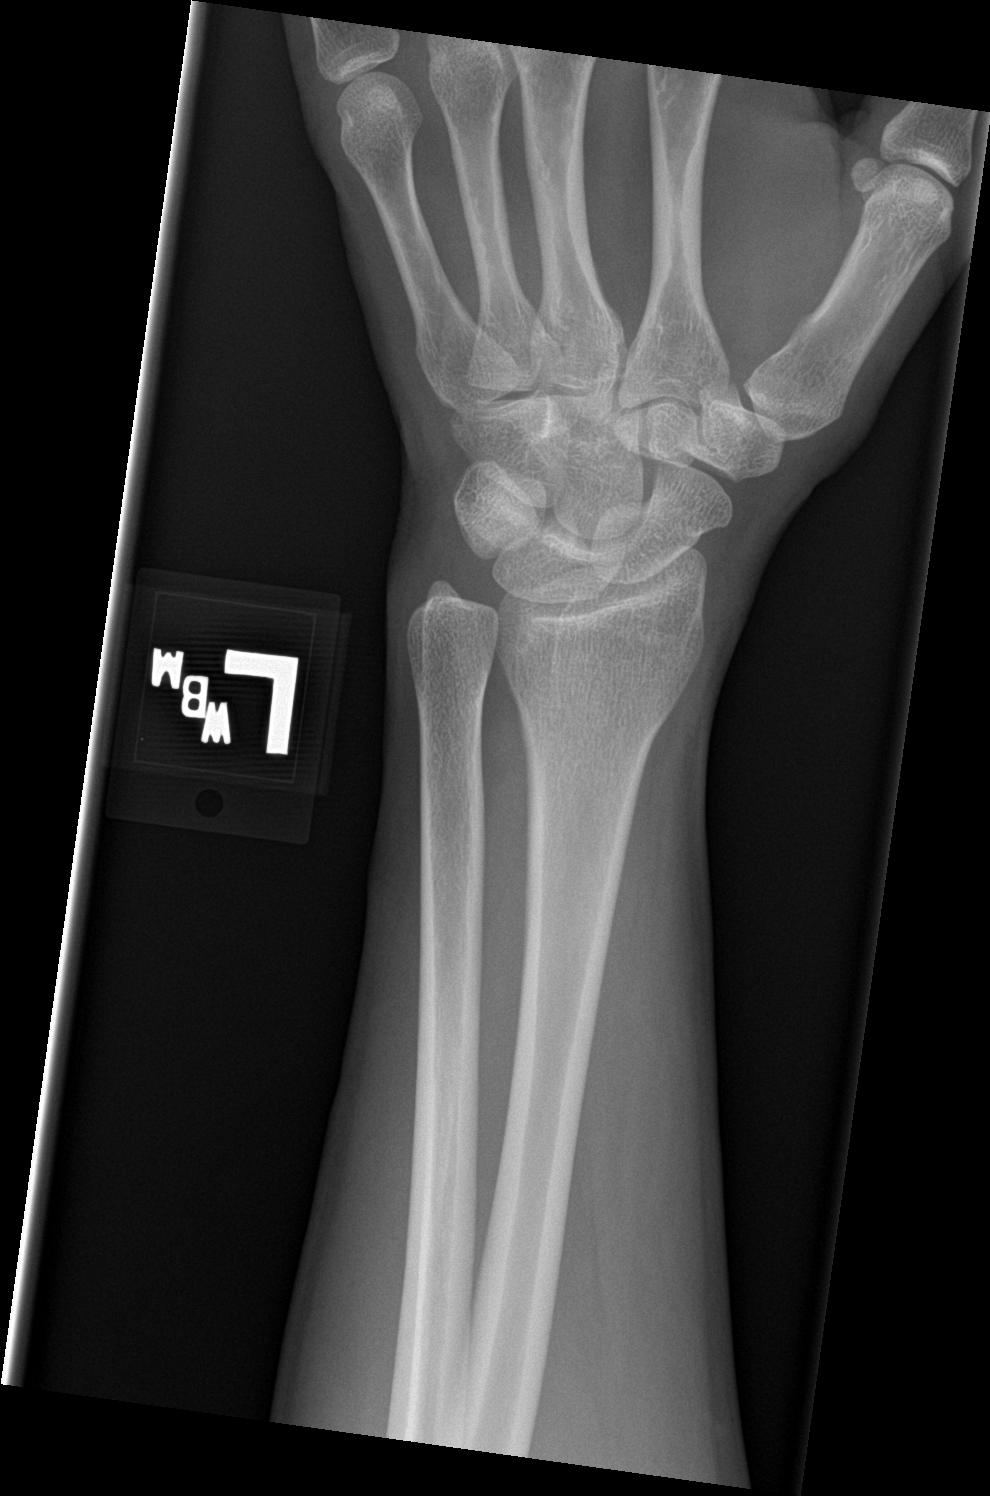

[wrist lat]
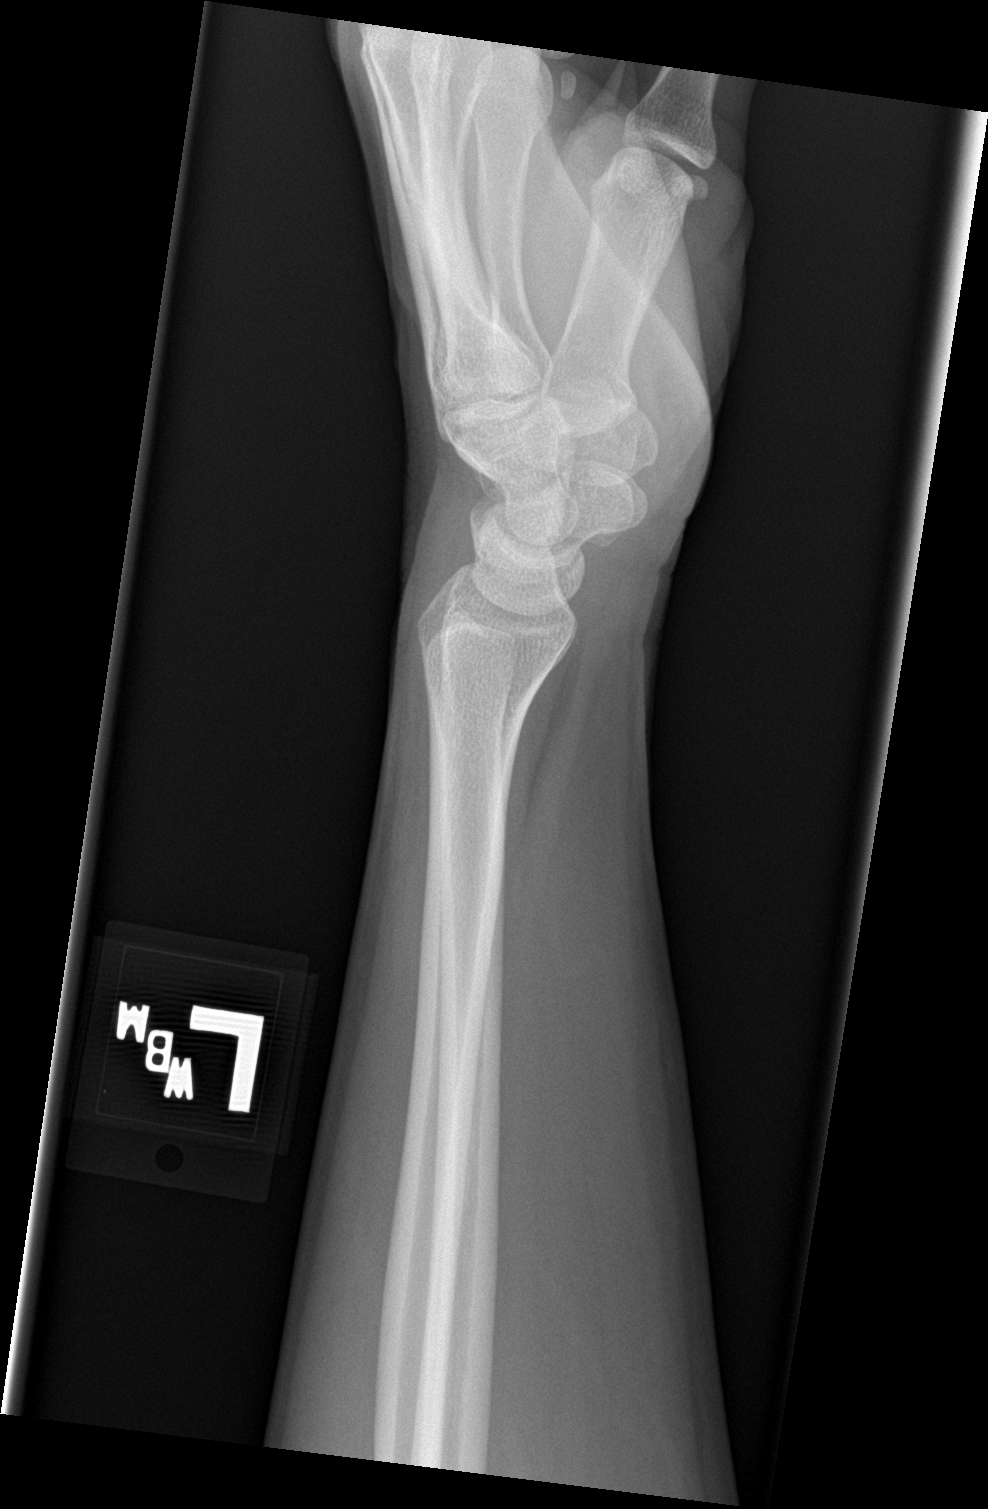

[wrist navicular]
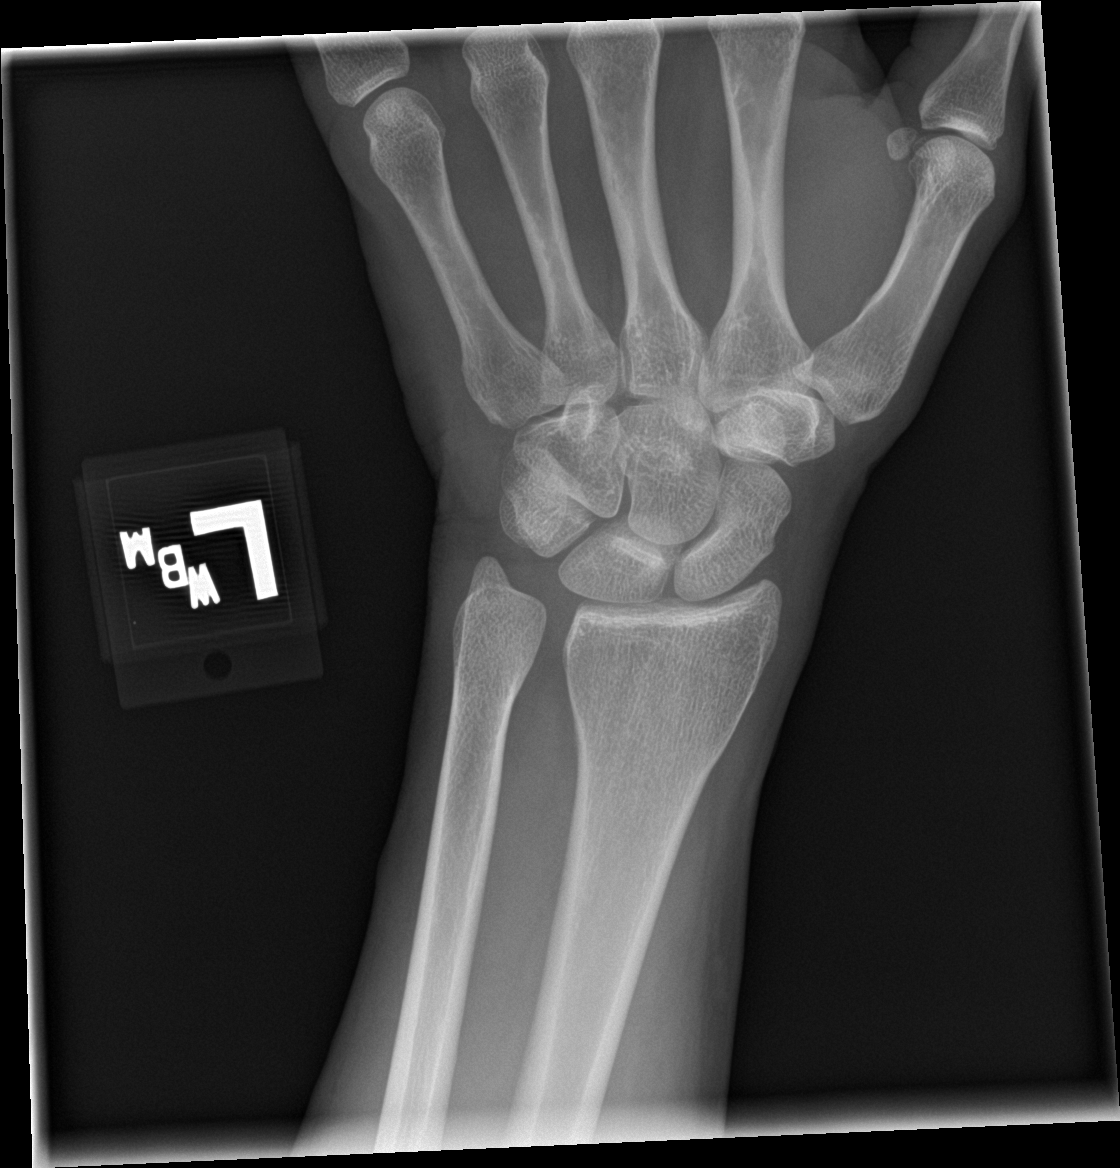

[4 of 4 positions shown; findings below may reference images not displayed]

FINDINGS: There is no evidence of fracture or dislocation. There is no
evidence of arthropathy or other focal bone abnormality. Soft
tissues are unremarkable.
IMPRESSION: Negative.
